# Patient Record
Sex: Male | Born: 1975 | Hispanic: Yes | Marital: Married | Smoking: Never smoker
Health system: Southern US, Community
[De-identification: ages and names within clinical notes are randomized; demographics above are authoritative.]

## PROBLEM LIST (undated history)

## (undated) DIAGNOSIS — Z789 Other specified health status: Secondary | ICD-10-CM

## (undated) HISTORY — PX: NO PAST SURGERIES: SHX2092

---

## 2020-11-13 DIAGNOSIS — J301 Allergic rhinitis due to pollen: Secondary | ICD-10-CM | POA: Insufficient documentation

## 2021-06-12 ENCOUNTER — Ambulatory Visit (INDEPENDENT_AMBULATORY_CARE_PROVIDER_SITE_OTHER): Payer: 59

## 2021-06-12 ENCOUNTER — Other Ambulatory Visit: Payer: Self-pay

## 2021-06-12 ENCOUNTER — Ambulatory Visit
Admission: EM | Admit: 2021-06-12 | Discharge: 2021-06-12 | Disposition: A | Discharge status: Home / Self Care | Payer: 59 | Attending: Nurse Practitioner | Admitting: Nurse Practitioner

## 2021-06-12 DIAGNOSIS — R059 Cough, unspecified: Secondary | ICD-10-CM | POA: Diagnosis not present

## 2021-06-12 DIAGNOSIS — K5900 Constipation, unspecified: Secondary | ICD-10-CM | POA: Diagnosis not present

## 2021-06-12 DIAGNOSIS — R079 Chest pain, unspecified: Secondary | ICD-10-CM

## 2021-06-12 DIAGNOSIS — R109 Unspecified abdominal pain: Secondary | ICD-10-CM

## 2021-06-12 DIAGNOSIS — R1011 Right upper quadrant pain: Secondary | ICD-10-CM | POA: Diagnosis not present

## 2021-06-12 MED ORDER — POLYETHYLENE GLYCOL 3350 17 GM/SCOOP PO POWD: 1.0000 | Freq: Once | ORAL | 0 refills | Status: AC

## 2021-06-12 MED ORDER — SENNA 8.6 MG PO TABS: 2.0000 | ORAL_TABLET | Freq: Every day | ORAL | 1 refills | Status: AC

## 2021-06-12 NOTE — ED Provider Notes (Signed)
[facility]    CSN: [id number] Arrival date & time: 06/12/21  1325      History   Chief Complaint Chief Complaint  Patient presents with   Back Pain    Chest and back pain    HPI Carl Hernandez is a 46 y.o. male.   The history is provided by the patient.   Patient presents with intermittent chest pain and abdominal pain that has been present for the past 2 weeks.  States pain starts in his mid right back and radiates to the right side of his chest/flank.  He denies shortness of breath, difficulty breathing, nausea, vomiting, or radiation of pain into his neck and jaw.  Patient also denies any previous history of reflux, although he does admit to having a history of seasonal allergies and has postnasal drainage which causes him to have moderate throat clearing.  He states that he does take Zyrtec daily.  Patient reports that he has not taken any medications for his symptoms.  He currently does not have a primary care physician.  History reviewed. No pertinent past medical history.  There are no problems to display for this patient.   History reviewed. No pertinent surgical history.     Home Medications    Prior to Admission medications   Medication Sig Start Date End Date Taking? Authorizing Provider  polyethylene glycol powder (GLYCOLAX/MIRALAX) 17 GM/SCOOP powder Take 255 g by mouth once for 1 dose. 06/12/21 06/12/21 Yes Keelee Yankey-Warren, Alda Lea, NP  senna (SENOKOT) 8.6 MG TABS tablet Take 2 tablets (17.2 mg total) by mouth at bedtime. 06/12/21 08/11/21 Yes Kenedy Haisley-Warren, Alda Lea, NP    Family History Family History  Problem Relation Age of Onset   Diabetes Mother    Heart attack Father     Social History Social History   Tobacco Use   Smoking status: Never    Passive exposure: Never   Smokeless tobacco: Never  Vaping Use   Vaping Use: Never used  Substance Use Topics   Alcohol use: Yes    Comment: Occas   Drug use: Never     Allergies   Patient  has no known allergies.   Review of Systems Review of Systems Per HPI  Physical Exam Triage Vital Signs ED Triage Vitals  Enc Vitals Group     BP 06/12/21 1353 134/83     Pulse Rate 06/12/21 1353 86     Resp 06/12/21 1353 20     Temp 06/12/21 1353 98.9 F (37.2 C)     Temp Source 06/12/21 1353 Oral     SpO2 06/12/21 1353 95 %     Weight --      Height --      Head Circumference --      Peak Flow --      Pain Score 06/12/21 1356 7     Pain Loc --      Pain Edu? --      Excl. in [place]? --    No data found.  Updated Vital Signs BP 134/83 (BP Location: Right Arm)   Pulse 86   Temp 98.9 F (37.2 C) (Oral)   Resp 20   SpO2 95%   Visual Acuity Right Eye Distance:   Left Eye Distance:   Bilateral Distance:    Right Eye Near:   Left Eye Near:    Bilateral Near:     Physical Exam Vitals and nursing note reviewed.  Constitutional:      General:  He is not in acute distress.    Appearance: He is well-developed.  HENT:     Head: Normocephalic and atraumatic.     Right Ear: Tympanic membrane, ear canal and external ear normal.     Left Ear: Tympanic membrane, ear canal and external ear normal.     Nose: Nose normal.  Eyes:     Extraocular Movements: Extraocular movements intact.     Conjunctiva/sclera: Conjunctivae normal.     Pupils: Pupils are equal, round, and reactive to light.  Cardiovascular:     Rate and Rhythm: Normal rate and regular rhythm.     Pulses: Normal pulses.     Heart sounds: Normal heart sounds. No murmur heard. Pulmonary:     Effort: Pulmonary effort is normal. No respiratory distress.     Breath sounds: Normal breath sounds.  Abdominal:     General: Bowel sounds are normal.     Palpations: Abdomen is soft.     Tenderness: There is no abdominal tenderness.  Musculoskeletal:        General: No swelling.     Cervical back: Neck supple.  Skin:    General: Skin is warm and dry.     Capillary Refill: Capillary refill takes less than 2 seconds.   Neurological:     General: No focal deficit present.     Mental Status: He is alert and oriented to person, place, and time.  Psychiatric:        Mood and Affect: Mood normal.        Behavior: Behavior normal.     UC Treatments / Results  Labs (all labs ordered are listed, but only abnormal results are displayed) Labs Reviewed - No data to display  EKG: NSR, no STEMI   Radiology DG Chest 2 View  Result Date: 06/12/2021 CLINICAL DATA:  chest painR sided CP traveling to his back x2 weeks with a cough EXAM: CHEST - 2 VIEW COMPARISON:  None Available. FINDINGS: The heart size and mediastinal contours are within normal limits. Both lungs are clear. The visualized skeletal structures are unremarkable. IMPRESSION: No active cardiopulmonary disease. Electronically Signed   By: Abigail Miyamoto M.D.   On: 06/12/2021 14:18   DG Abd 1 View  Result Date: 06/12/2021 CLINICAL DATA:  Abdominal pain EXAM: ABDOMEN - 1 VIEW COMPARISON:  None Available. FINDINGS: Lower chest and a small portion of the upper abdomen is excluded from the field of view. There is no evidence of bowel obstruction. Moderate stool burden. There are no radiopaque calculi overlying the kidneys or course of the ureters. No acute osseous abnormality. There is a focal sclerotic lesion within the left proximal femur, likely a bone island. IMPRESSION: Nonobstructive bowel gas pattern.  Moderate stool burden. No radiopaque calculi overlie the kidneys or course of the ureters. Lower chest and a small portion of the upper abdomen is excluded from the field of view. Electronically Signed   By: Maurine Simmering M.D.   On: 06/12/2021 14:48    Procedures Procedures (including critical care time)  Medications Ordered in UC Medications - No data to display  Initial Impression / Assessment and Plan / UC Course  I have reviewed the triage vital signs and the nursing notes.  Pertinent labs & imaging results that were available during my care of the  patient were reviewed by me and considered in my medical decision making (see chart for details).  Patient presents with complaints of chest/right upper quadrant abdominal pain that has been  present for the past 2 weeks.  Patient states pain radiates into his mid back.  His exam is reassuring, his vital signs were stable.  EKG showed normal sinus rhythm, no STEMI.  His chest x-ray was also normal.  Abdominal x-ray was performed which did show moderate stool burden.  No symptoms of kidney stone were present.  Patient was provided prescriptions for MiraLAX and senna for his constipation.  Recommended Tylenol as needed for pain.  Supportive care recommendations were provided to the patient.  Patient was offered PCP assistance during the visit today.  Patient advised to follow-up if he develops chest pain that radiates into his arm or jaw, shortness of breath, difficulty breathing, with nausea and vomiting. Final Clinical Impressions(s) / UC Diagnoses   Final diagnoses:  Abdominal pain, right upper quadrant  Constipation, unspecified constipation type     Discharge Instructions      Your EKG and chest x-ray were negative.  Your abdominal x-ray did show constipation which is consistent with your symptoms. Take medication as prescribed. Increase fluids.  Also recommend increasing the fiber in your diet. May take Tylenol as needed for pain or discomfort. As discussed, PCP assistance has been provided for you today.  You should receive a call within the next week to help you find a primary care physician. Follow-up as needed. Go the the emergency department if you develop chest pain that radiates into his arm or jaw, shortness of breath, difficulty breathing, with nausea and vomiting.     ED Prescriptions     Medication Sig Dispense Auth. Provider   polyethylene glycol powder (GLYCOLAX/MIRALAX) 17 GM/SCOOP powder Take 255 g by mouth once for 1 dose. 255 g Shelisa Fern-Warren, Alda Lea, NP   senna  (SENOKOT) 8.6 MG TABS tablet Take 2 tablets (17.2 mg total) by mouth at bedtime. 60 tablet Katlin Bortner-Warren, Alda Lea, NP      PDMP not reviewed this encounter.   Tish Men, NP 06/12/21 1517

## 2021-06-12 NOTE — ED Triage Notes (Signed)
Pt states that off and on for the last 2 weeks   Pt states he is having pain in his right breast area and sometimes in his left breast area radiating to his back  Pt states that he has lingering cough

## 2021-06-12 NOTE — Discharge Instructions (Addendum)
Your EKG and chest x-ray were negative.  Your abdominal x-ray did show constipation which is consistent with your symptoms. Take medication as prescribed. Increase fluids.  Also recommend increasing the fiber in your diet. May take Tylenol as needed for pain or discomfort. As discussed, PCP assistance has been provided for you today.  You should receive a call within the next week to help you find a primary care physician. Follow-up as needed. Go the the emergency department if you develop chest pain that radiates into his arm or jaw, shortness of breath, difficulty breathing, with nausea and vomiting.

## 2021-07-19 ENCOUNTER — Ambulatory Visit
Admission: EM | Admit: 2021-07-19 | Discharge: 2021-07-19 | Disposition: A | Discharge status: Home / Self Care | Payer: 59 | Attending: Nurse Practitioner | Admitting: Nurse Practitioner

## 2021-07-19 DIAGNOSIS — M545 Low back pain, unspecified: Secondary | ICD-10-CM | POA: Diagnosis not present

## 2021-07-19 MED ORDER — IBUPROFEN 800 MG PO TABS: 800.0000 mg | ORAL_TABLET | Freq: Three times a day (TID) | ORAL | 0 refills | Status: DC | PRN

## 2021-07-19 MED ORDER — KETOROLAC TROMETHAMINE 30 MG/ML IJ SOLN
30.0000 mg | Freq: Once | INTRAMUSCULAR | Status: AC
  Administered 2021-07-19: 30 mg via INTRAMUSCULAR

## 2021-07-19 MED ORDER — DEXAMETHASONE SODIUM PHOSPHATE 10 MG/ML IJ SOLN
10.0000 mg | INTRAMUSCULAR | Status: AC
  Administered 2021-07-19: 10 mg via INTRAMUSCULAR

## 2021-07-19 MED ORDER — METHOCARBAMOL 500 MG PO TABS: 500.0000 mg | ORAL_TABLET | Freq: Two times a day (BID) | ORAL | 0 refills | Status: DC

## 2021-07-19 NOTE — ED Triage Notes (Signed)
Pt presents with c/o lower back pain after throwing object in trash this am, pain radiates to left leg

## 2021-07-19 NOTE — ED Provider Notes (Signed)
[facility]    CSN: [id number] Arrival date & time: 07/19/21  4403      History   Chief Complaint Chief Complaint  Patient presents with   Back Pain    HPI Carl Hernandez is a 46 y.o. male.   The history is provided by the patient.   Patient presents for complaints of low back pain that started when he was throwing a Q-tip in the trash can this morning.  Patient states he felt a "catch" in the lower back.  He continues to have worsening back pain and spasms per his report.  Pain is greater on the left side versus the right.  He denies numbness, tingling, loss of bowel or bladder function, lower extremity weakness, urinary symptoms, or paresthesias.  He states that this usually happens to him once a year.  States that he took over-the-counter ibuprofen for his pain this morning.  History reviewed. No pertinent past medical history.  There are no problems to display for this patient.   History reviewed. No pertinent surgical history.     Home Medications    Prior to Admission medications   Medication Sig Start Date End Date Taking? Authorizing Provider  ibuprofen (ADVIL) 800 MG tablet Take 1 tablet (800 mg total) by mouth every 8 (eight) hours as needed. 07/19/21  Yes Avice Funchess-Warren, Alda Lea, NP  methocarbamol (ROBAXIN) 500 MG tablet Take 1 tablet (500 mg total) by mouth 2 (two) times daily. 07/19/21  Yes Daveah Varone-Warren, Alda Lea, NP  senna (SENOKOT) 8.6 MG TABS tablet Take 2 tablets (17.2 mg total) by mouth at bedtime. 06/12/21 08/11/21  Omari Mcmanaway-Warren, Alda Lea, NP    Family History Family History  Problem Relation Age of Onset   Diabetes Mother    Heart attack Father     Social History Social History   Tobacco Use   Smoking status: Never    Passive exposure: Never   Smokeless tobacco: Never  Vaping Use   Vaping Use: Never used  Substance Use Topics   Alcohol use: Yes    Comment: Occas   Drug use: Never     Allergies   Patient has no known  allergies.   Review of Systems Review of Systems Per HPI  Physical Exam Triage Vital Signs ED Triage Vitals  Enc Vitals Group     BP 07/19/21 0937 122/82     Pulse Rate 07/19/21 0937 (!) 56     Resp 07/19/21 0937 20     Temp 07/19/21 0937 98.1 F (36.7 C)     Temp src --      SpO2 07/19/21 0937 97 %     Weight --      Height --      Head Circumference --      Peak Flow --      Pain Score 07/19/21 0934 8     Pain Loc --      Pain Edu? --      Excl. in [place]? --    No data found.  Updated Vital Signs BP 122/82   Pulse (!) 56   Temp 98.1 F (36.7 C)   Resp 20   SpO2 97%   Visual Acuity Right Eye Distance:   Left Eye Distance:   Bilateral Distance:    Right Eye Near:   Left Eye Near:    Bilateral Near:     Physical Exam Vitals and nursing note reviewed.  Constitutional:      General: He is not  in acute distress.    Appearance: Normal appearance.  HENT:     Head: Normocephalic.  Eyes:     Extraocular Movements: Extraocular movements intact.     Conjunctiva/sclera: Conjunctivae normal.     Pupils: Pupils are equal, round, and reactive to light.  Cardiovascular:     Rate and Rhythm: Normal rate and regular rhythm.     Pulses: Normal pulses.  Pulmonary:     Effort: Pulmonary effort is normal.  Abdominal:     General: Bowel sounds are normal.     Palpations: Abdomen is soft.  Musculoskeletal:     Cervical back: Normal range of motion.     Lumbar back: Spasms and tenderness present. Decreased range of motion. Negative right straight leg raise test and negative left straight leg raise test.     Comments: Tenderness to the lumbar spine from L2-L5.  Bilateral paraspinal muscle tenderness.  Skin:    General: Skin is warm and dry.  Neurological:     General: No focal deficit present.     Mental Status: He is alert and oriented to person, place, and time.  Psychiatric:        Mood and Affect: Mood normal.        Behavior: Behavior normal.      UC  Treatments / Results  Labs (all labs ordered are listed, but only abnormal results are displayed) Labs Reviewed - No data to display  EKG   Radiology No results found.  Procedures Procedures (including critical care time)  Medications Ordered in UC Medications  ketorolac (TORADOL) 30 MG/ML injection 30 mg (30 mg Intramuscular Given 07/19/21 1024)  dexamethasone (DECADRON) injection 10 mg (10 mg Intramuscular Given 07/19/21 1024)    Initial Impression / Assessment and Plan / UC Course  I have reviewed the triage vital signs and the nursing notes.  Pertinent labs & imaging results that were available during my care of the patient were reviewed by me and considered in my medical decision making (see chart for details).  Patient presents for complaints of low back pain after throwing an object into the trash can this morning.  On exam, patient has tenderness to the lumbar spine and paraspinal region.  Tenderness is greater on the left side.  There are no red flags noted on his exam.  Toradol and Decadron injections were given in the clinic today.  Patient was prescribed ibuprofen and methocarbamol for his symptoms.  Supportive care recommendations were provided to the patient.  Patient was also given strict indications of when to go to the emergency department.  Patient advised to follow-up with his primary care if symptoms do not improve. Final Clinical Impressions(s) / UC Diagnoses   Final diagnoses:  Lumbar pain     Discharge Instructions      Take medication as prescribed. Try to remain as active as possible. Gentle range of motion and stretching exercises to help with back spasm and pain. May apply ice or heat as needed.  Ice is recommended for pain or swelling, heat for spasm or stiffness.  Apply for 20 minutes, remove for 1 hour, then repeat. May take over-the-counter Tylenol extra strength 500 mg tablet approximately 1 -2 hours after taking ibuprofen for breakthrough  pain. Go to the emergency department immediately if you develop weakness in your legs or feet, inability to walk, loss of bowel or bladder function, difficulty urinating or passing a bowel movement, or other concerns. Follow-up with your primary care physician if your symptoms  do not improve.      ED Prescriptions     Medication Sig Dispense Auth. Provider   ibuprofen (ADVIL) 800 MG tablet Take 1 tablet (800 mg total) by mouth every 8 (eight) hours as needed. 30 tablet Debie Ashline-Warren, Alda Lea, NP   methocarbamol (ROBAXIN) 500 MG tablet Take 1 tablet (500 mg total) by mouth 2 (two) times daily. 20 tablet Aurielle Slingerland-Warren, Alda Lea, NP      PDMP not reviewed this encounter.   Tish Men, NP 07/19/21 1031

## 2021-07-19 NOTE — Discharge Instructions (Addendum)
Take medication as prescribed. Try to remain as active as possible. Gentle range of motion and stretching exercises to help with back spasm and pain. May apply ice or heat as needed.  Ice is recommended for pain or swelling, heat for spasm or stiffness.  Apply for 20 minutes, remove for 1 hour, then repeat. May take over-the-counter Tylenol extra strength 500 mg tablet approximately 1 -2 hours after taking ibuprofen for breakthrough pain. Go to the emergency department immediately if you develop weakness in your legs or feet, inability to walk, loss of bowel or bladder function, difficulty urinating or passing a bowel movement, or other concerns. Follow-up with your primary care physician if your symptoms do not improve.

## 2021-08-22 DIAGNOSIS — K219 Gastro-esophageal reflux disease without esophagitis: Secondary | ICD-10-CM | POA: Insufficient documentation

## 2021-09-05 ENCOUNTER — Other Ambulatory Visit (HOSPITAL_COMMUNITY): Payer: Self-pay | Admitting: Nurse Practitioner

## 2021-09-05 DIAGNOSIS — R002 Palpitations: Secondary | ICD-10-CM

## 2021-09-06 ENCOUNTER — Ambulatory Visit (HOSPITAL_COMMUNITY)
Admission: RE | Admit: 2021-09-06 | Discharge: 2021-09-06 | Disposition: A | Discharge status: Home / Self Care | Payer: 59 | Source: Ambulatory Visit | Attending: Nurse Practitioner | Admitting: Nurse Practitioner

## 2021-09-06 DIAGNOSIS — R002 Palpitations: Secondary | ICD-10-CM | POA: Diagnosis present

## 2021-09-06 LAB — ECHOCARDIOGRAM COMPLETE
AR max vel: 2.62 cm2
AV Area VTI: 2.86 cm2
AV Area mean vel: 2.77 cm2
AV Mean grad: 3 mmHg
AV Peak grad: 7.6 mmHg
Ao pk vel: 1.38 m/s
Area-P 1/2: 3.33 cm2
Calc EF: 57.3 %
MV VTI: 2.78 cm2
S' Lateral: 3.1 cm
Single Plane A2C EF: 55.8 %
Single Plane A4C EF: 60.2 %

## 2021-09-06 NOTE — Progress Notes (Signed)
*  PRELIMINARY RESULTS* Echocardiogram 2D Echocardiogram has been performed.  Carl Hernandez 09/06/2021, 10:29 AM

## 2021-09-26 DIAGNOSIS — R0981 Nasal congestion: Secondary | ICD-10-CM | POA: Insufficient documentation

## 2021-09-26 DIAGNOSIS — R5381 Other malaise: Secondary | ICD-10-CM | POA: Insufficient documentation

## 2021-11-06 ENCOUNTER — Encounter: Payer: Self-pay | Admitting: Internal Medicine

## 2021-11-06 ENCOUNTER — Ambulatory Visit: Payer: 59 | Admitting: Internal Medicine

## 2021-11-06 VITALS — BP 127/82 | HR 69 | Ht 65.0 in | Wt 218.2 lb

## 2021-11-06 DIAGNOSIS — Z0001 Encounter for general adult medical examination with abnormal findings: Secondary | ICD-10-CM | POA: Diagnosis not present

## 2021-11-06 DIAGNOSIS — G8929 Other chronic pain: Secondary | ICD-10-CM

## 2021-11-06 DIAGNOSIS — R002 Palpitations: Secondary | ICD-10-CM | POA: Diagnosis not present

## 2021-11-06 DIAGNOSIS — R0789 Other chest pain: Secondary | ICD-10-CM

## 2021-11-06 NOTE — Assessment & Plan Note (Signed)
He reports a history of heart palpitations that have become more frequent and noticeable lately.  He underwent echocardiogram in August due to palpitations, which was unremarkable. -Referral placed to cardiology for further evaluation

## 2021-11-06 NOTE — Progress Notes (Signed)
New Patient Office Visit  Subjective    Patient ID: Carl Hernandez, male    DOB: 01-25-1975  Age: 46 y.o. MRN: [id number]  CC:  Chief Complaint  Patient presents with   Establish Care    HPI Carl Hernandez presents to establish care.  He is a 46 year old male with a past medical history significant for heart palpitations.  He was previously followed by Dr. Wende Neighbors.  Carl Hernandez acute concern today is chronic soreness on the left side of his chest.  He states this has been present for 6 to 7 years.  He has been unable to identify any exacerbating or alleviating factors.  No one has previously been able to give him explanation for his symptoms, which she describes as more of a dull aching vs pain.  He has been unable to identify exacerbating and alleviating factors.  He has previously undergone cardiac evaluation, which has been unremarkable.  Carl Hernandez also endorses recent heart palpitations that are getting worse.  He is concerned that this is related to his chest wall discomfort.  He underwent an echocardiogram in late August, which was unremarkable.  Acute concerns, chronic medical conditions, and outstanding preventative care items discussed today are individually addressed in A/P below  Outpatient Encounter Medications as of 11/06/2021  Medication Sig   ibuprofen (ADVIL) 800 MG tablet Take 1 tablet (800 mg total) by mouth every 8 (eight) hours as needed.   pantoprazole (PROTONIX) 40 MG tablet Take 40 mg by mouth daily.   [DISCONTINUED] methocarbamol (ROBAXIN) 500 MG tablet Take 1 tablet (500 mg total) by mouth 2 (two) times daily.   No facility-administered encounter medications on file as of 11/06/2021.   History reviewed. No pertinent past medical history.  History reviewed. No pertinent surgical history.  Family History  Problem Relation Age of Onset   Diabetes Mother    Heart attack Father    Social History   Socioeconomic History   Marital status: Married     Spouse name: Not on file   Number of children: Not on file   Years of education: Not on file   Highest education level: Not on file  Occupational History   Not on file  Tobacco Use   Smoking status: Never    Passive exposure: Never   Smokeless tobacco: Never  Vaping Use   Vaping Use: Never used  Substance and Sexual Activity   Alcohol use: Yes    Comment: Occas   Drug use: Never   Sexual activity: Yes    Birth control/protection: None  Other Topics Concern   Not on file  Social History Narrative   Not on file   Social Determinants of Health   Financial Resource Strain: Not on file  Food Insecurity: Not on file  Transportation Needs: Not on file  Physical Activity: Not on file  Stress: Not on file  Social Connections: Not on file  Intimate Partner Violence: Not on file    Review of Systems  Constitutional:  Negative for chills and fever.  HENT:  Negative for sore throat.   Respiratory:  Negative for cough and shortness of breath.   Cardiovascular:  Positive for palpitations. Negative for chest pain and leg swelling.  Gastrointestinal:  Negative for abdominal pain, blood in stool, constipation, diarrhea, nausea and vomiting.  Genitourinary:  Negative for dysuria and hematuria.  Musculoskeletal:  Negative for myalgias.       Left lower chest wall discomfort  Skin:  Negative for itching and  rash.  Neurological:  Negative for dizziness and headaches.  Psychiatric/Behavioral:  Negative for depression and suicidal ideas.    Objective    BP 127/82   Pulse 69   Ht '5\' 5"'$  (1.651 m)   Wt 218 lb 3.2 oz (99 kg)   SpO2 98%   BMI 36.31 kg/m   Physical Exam Vitals reviewed.  Constitutional:      General: He is not in acute distress.    Appearance: Normal appearance. He is obese. He is not ill-appearing.  HENT:     Head: Normocephalic and atraumatic.     Nose: Nose normal. No congestion or rhinorrhea.     Mouth/Throat:     Mouth: Mucous membranes are moist.      Pharynx: Oropharynx is clear.  Eyes:     Extraocular Movements: Extraocular movements intact.     Conjunctiva/sclera: Conjunctivae normal.     Pupils: Pupils are equal, round, and reactive to light.  Cardiovascular:     Rate and Rhythm: Normal rate and regular rhythm.     Pulses: Normal pulses.     Heart sounds: Normal heart sounds. No murmur heard. Pulmonary:     Effort: Pulmonary effort is normal.     Breath sounds: Normal breath sounds. No wheezing, rhonchi or rales.  Abdominal:     General: Abdomen is flat. Bowel sounds are normal. There is no distension.     Palpations: Abdomen is soft.     Tenderness: There is no abdominal tenderness.  Musculoskeletal:        General: No swelling or deformity. Normal range of motion.     Cervical back: Normal range of motion.     Comments: No obvious deformity on inspection of the left chest wall.  There is no significant tenderness to palpation.  There is a discrepancy in consistency and palpation of the left lower chest wall vs right.  The left chest wall has increased tissue density compared to the right.  Skin:    General: Skin is warm and dry.     Capillary Refill: Capillary refill takes less than 2 seconds.  Neurological:     General: No focal deficit present.     Mental Status: He is alert and oriented to person, place, and time.     Motor: No weakness.  Psychiatric:        Mood and Affect: Mood normal.        Behavior: Behavior normal.        Thought Content: Thought content normal.    Assessment & Plan:   Problem List Items Addressed This Visit       Encounter for general adult medical examination with abnormal findings    Presenting today to establish care.  Previously followed Dr. [place] Crane.  Medical records requested today. -He states that he recently had baseline lab work performed by Dr. [place] Crane and requests that we not repeat labs today. -Outstanding immunizations deferred today -He states that he was previously referred to GI  for colonoscopy but was told that his insurance would not cover screening (age 44-49).  He plans to contact his insurance company to see if they will cover screening colonoscopy. -Follow-up in 2-4 weeks to review recent labs and address outstanding preventative care items      Left-sided chest wall pain    Chronic issue with unclear etiology.  He has not been able to identify any exacerbating or alleviating factors.  Previous cardiac work-up unremarkable.  His discomfort seems rather superficial.  On palpation of the left chest wall there is increased tissue density compared to the right.  Previous chest x-ray does not show any obvious cause. -Previous records from Dr. [place] Crane requested. -He will follow-up in 2-4 weeks.  At that time we can ultrasound his chest wall and consider CT.      Heart palpitations    He reports a history of heart palpitations that have become more frequent and noticeable lately.  He underwent echocardiogram in August due to palpitations, which was unremarkable. -Referral placed to cardiology for further evaluation      Return in about 2 weeks (around 11/20/2021).   [name], MD

## 2021-11-06 NOTE — Patient Instructions (Signed)
It was a pleasure to see you today.  Thank you for giving [place] the opportunity to be involved in your care.  Below is a brief recap of your visit and next steps.  We will plan to see you again in 2-4 weeks.  Summary You have established care today. We will get records from Dr. [place] Crane I have referred you to cardiology to discuss heart palpations and next steps for workup I will discuss ultrasound vs CT with my colleague for chest wall evaluation

## 2021-11-06 NOTE — Assessment & Plan Note (Signed)
Chronic issue with unclear etiology.  He has not been able to identify any exacerbating or alleviating factors.  Previous cardiac work-up unremarkable.  His discomfort seems rather superficial.  On palpation of the left chest wall there is increased tissue density compared to the right.  Previous chest x-ray does not show any obvious cause. -Previous records from Dr. [place] Crane requested. -He will follow-up in 2-4 weeks.  At that time we can ultrasound his chest wall and consider CT.

## 2021-11-06 NOTE — Assessment & Plan Note (Signed)
Presenting today to establish care.  Previously followed Dr. [place] Crane.  Medical records requested today. -He states that he recently had baseline lab work performed by Dr. [place] Crane and requests that we not repeat labs today. -Outstanding immunizations deferred today -He states that he was previously referred to GI for colonoscopy but was told that his insurance would not cover screening (age 46-49).  He plans to contact his insurance company to see if they will cover screening colonoscopy. -Follow-up in 2-4 weeks to review recent labs and address outstanding preventative care items

## 2021-11-20 ENCOUNTER — Ambulatory Visit (INDEPENDENT_AMBULATORY_CARE_PROVIDER_SITE_OTHER): Payer: 59 | Admitting: Internal Medicine

## 2021-11-20 ENCOUNTER — Encounter: Payer: Self-pay | Admitting: Internal Medicine

## 2021-11-20 VITALS — BP 114/83 | HR 82 | Ht 65.0 in | Wt 215.0 lb

## 2021-11-20 DIAGNOSIS — E7800 Pure hypercholesterolemia, unspecified: Secondary | ICD-10-CM

## 2021-11-20 DIAGNOSIS — R0789 Other chest pain: Secondary | ICD-10-CM

## 2021-11-20 DIAGNOSIS — R002 Palpitations: Secondary | ICD-10-CM | POA: Diagnosis not present

## 2021-11-20 DIAGNOSIS — Z1211 Encounter for screening for malignant neoplasm of colon: Secondary | ICD-10-CM | POA: Diagnosis not present

## 2021-11-20 NOTE — Progress Notes (Signed)
Established Patient Office Visit  Subjective   Patient ID: Carl Hernandez, male    DOB: October 27, 1975  Age: 46 y.o. MRN: [id number]  Chief Complaint  Patient presents with   Follow-up   Carl Hernandez returns to care today.  He was last seen by me on 10/31 at which time he endorsed chronic soreness on the left side of his chest as well as heart palpitations.  He was referred to cardiology for further cardiac evaluation and records from his previous PCP were requested.  2-week follow-up was arranged to further discuss and address pain in his left chest wall.  There have been no acute interval events.  Today Carl Hernandez reports feeling well.  He has a cardiology appointment scheduled for January.  He continues to endorse left-sided chest wall pain and occasional palpitations.  He is otherwise asymptomatic without acute concerns discussed.  History reviewed. No pertinent past medical history. History reviewed. No pertinent surgical history. Social History   Tobacco Use   Smoking status: Never    Passive exposure: Never   Smokeless tobacco: Never  Vaping Use   Vaping Use: Never used  Substance Use Topics   Alcohol use: Yes    Comment: Occas   Drug use: Never   Family History  Problem Relation Age of Onset   Diabetes Mother    Heart attack Father    No Known Allergies  Review of Systems  Cardiovascular:  Positive for palpitations.  All other systems reviewed and are negative.    Objective:     BP 114/83   Pulse 82   Ht '5\' 5"'$  (1.651 m)   Wt 215 lb (97.5 kg)   SpO2 96%   BMI 35.78 kg/m  BP Readings from Last 3 Encounters:  11/20/21 114/83  11/06/21 127/82  07/19/21 122/82   Physical Exam Vitals reviewed.  Constitutional:      General: He is not in acute distress.    Appearance: Normal appearance. He is not ill-appearing.  HENT:     Head: Normocephalic and atraumatic.     Right Ear: External ear normal.     Left Ear: External ear normal.     Nose: Nose normal. No  congestion or rhinorrhea.     Mouth/Throat:     Mouth: Mucous membranes are moist.     Pharynx: Oropharynx is clear.  Eyes:     Extraocular Movements: Extraocular movements intact.     Conjunctiva/sclera: Conjunctivae normal.     Pupils: Pupils are equal, round, and reactive to light.  Cardiovascular:     Rate and Rhythm: Normal rate and regular rhythm.     Pulses: Normal pulses.     Heart sounds: Normal heart sounds. No murmur heard. Pulmonary:     Effort: Pulmonary effort is normal.     Breath sounds: Normal breath sounds. No wheezing, rhonchi or rales.  Abdominal:     General: Abdomen is flat. Bowel sounds are normal. There is no distension.     Palpations: Abdomen is soft.     Tenderness: There is no abdominal tenderness.  Musculoskeletal:        General: No swelling or deformity. Normal range of motion.     Cervical back: Normal range of motion.     Comments: There increased density along the left anterior chest wall inferior and lateral to the nipple. The area is non-tender to palpation.  Skin:    General: Skin is warm and dry.     Capillary Refill: Capillary refill  takes less than 2 seconds.  Neurological:     General: No focal deficit present.     Mental Status: He is alert and oriented to person, place, and time.     Motor: No weakness.  Psychiatric:        Mood and Affect: Mood normal.        Behavior: Behavior normal.        Thought Content: Thought content normal.      Assessment & Plan:   Problem List Items Addressed This Visit       Left-sided chest wall pain    He returns to care today for evaluation of a left chest wall mass that has been present for several years without explanation.  The area is located on the anterior left chest wall inferior and lateral to the left nipple.  There is no tenderness to palpation on exam.  This area was examined with ultrasound today and there were no obvious concerning findings.  I discussed these results with Mr.  Carl Hernandez, who does not desire further work-up or imaging at this time.      Heart palpitations    He continues to endorse occasional heart palpitations today.  He was previously referred to cardiology and has an appointment scheduled for January.      Elevated LDL cholesterol level    Lipid panel from August reviewed today.  Total cholesterol 163, LDL 106.  We discussed limiting fatty and fried foods in order to lower his LDL.      Screening for colon cancer - Primary    GI referral placed today for screening colonoscopy       Return in about 3 months (around 02/20/2022).    Carl Abraham, MD

## 2021-11-20 NOTE — Patient Instructions (Signed)
It was a pleasure to see you today.  Thank you for giving [place] the opportunity to be involved in your care.  Below is a brief recap of your visit and next steps.  We will plan to see you again in 3 months.  Summary No medication changes today We will follow up in 3 months You have been referred for screening colonoscopy

## 2021-11-21 ENCOUNTER — Encounter: Payer: Self-pay | Admitting: *Deleted

## 2021-11-26 DIAGNOSIS — Z1211 Encounter for screening for malignant neoplasm of colon: Secondary | ICD-10-CM | POA: Insufficient documentation

## 2021-11-26 DIAGNOSIS — E7800 Pure hypercholesterolemia, unspecified: Secondary | ICD-10-CM | POA: Insufficient documentation

## 2021-11-26 NOTE — Assessment & Plan Note (Signed)
He returns to care today for evaluation of a left chest wall mass that has been present for several years without explanation.  The area is located on the anterior left chest wall inferior and lateral to the left nipple.  There is no tenderness to palpation on exam.  This area was examined with ultrasound today and there were no obvious concerning findings.  I discussed these results with Carl Hernandez, who does not desire further work-up or imaging at this time.

## 2021-11-26 NOTE — Assessment & Plan Note (Signed)
He continues to endorse occasional heart palpitations today.  He was previously referred to cardiology and has an appointment scheduled for January.

## 2021-11-26 NOTE — Assessment & Plan Note (Signed)
GI referral placed today for screening colonoscopy

## 2021-11-26 NOTE — Assessment & Plan Note (Signed)
Lipid panel from August reviewed today.  Total cholesterol 163, LDL 106.  We discussed limiting fatty and fried foods in order to lower his LDL.

## 2021-12-13 ENCOUNTER — Encounter: Payer: Self-pay | Admitting: *Deleted

## 2021-12-13 NOTE — Patient Instructions (Signed)
  Procedure: colonoscopy  Estimated body mass index is 35.78 kg/m as calculated from the following:   Height as of this encounter: '5\' 5"'$  (1.651 m).   Weight as of this encounter: 215 lb (97.5 kg).   Have you had a colonoscopy before?  no  Do you have family history of colon cancer?  no  Do you have a family history of polyps? no  Previous colonoscopy with polyps removed? no  Do you have a history colorectal cancer?   no  Are you diabetic?  no  Do you have a prosthetic or mechanical heart valve? no  Do you have a pacemaker/defibrillator?   no  Have you had endocarditis/atrial fibrillation?  no  Do you use supplemental oxygen/CPAP?  no  Have you had joint replacement within the last 12 months?  no  Do you tend to be constipated or have to use laxatives?  no   Do you have history of alcohol use? If yes, how much and how often.  2 drinks per week  Do you have history or are you using drugs? If yes, what do are you  using?  no  Have you ever had a stroke/heart attack?  no  Have you ever had a heart or other vascular stent placed,?no  Do you take weight loss medication? no   Do you take any blood-thinning medications such as: (Plavix, aspirin, Coumadin, Aggrenox, Brilinta, Xarelto, Eliquis, Pradaxa, Savaysa or Effient)? no  If yes we need the name, milligram, dosage and who is prescribing doctor:               Current Outpatient Medications  Medication Sig Dispense Refill   pantoprazole (PROTONIX) 40 MG tablet Take 40 mg by mouth daily.     No current facility-administered medications for this visit.    No Known Allergies

## 2022-01-15 ENCOUNTER — Encounter: Payer: Self-pay | Admitting: *Deleted

## 2022-01-15 MED ORDER — PEG 3350-KCL-NA BICARB-NACL 420 G PO SOLR: 4000.0000 mL | Freq: Once | ORAL | 0 refills | Status: AC

## 2022-01-15 NOTE — Progress Notes (Signed)
Pt called back. Scheduled for 2/2 with Dr. Abbey Chatters. Aware will send instructions in mail. Rx for prep sent to pharmacy.

## 2022-01-15 NOTE — Progress Notes (Signed)
LMOVM to call back to schedule 

## 2022-01-18 ENCOUNTER — Encounter (INDEPENDENT_AMBULATORY_CARE_PROVIDER_SITE_OTHER): Payer: Self-pay | Admitting: *Deleted

## 2022-01-18 NOTE — Progress Notes (Signed)
Referral completed

## 2022-01-23 ENCOUNTER — Ambulatory Visit: Payer: 59 | Admitting: Internal Medicine

## 2022-02-06 ENCOUNTER — Other Ambulatory Visit (HOSPITAL_COMMUNITY): Payer: 59

## 2022-02-08 ENCOUNTER — Encounter (HOSPITAL_COMMUNITY): Payer: Self-pay

## 2022-02-08 ENCOUNTER — Ambulatory Visit (HOSPITAL_COMMUNITY)
Admission: RE | Admit: 2022-02-08 | Discharge: 2022-02-08 | Disposition: A | Discharge status: Home / Self Care | Payer: 59 | Attending: Internal Medicine | Admitting: Internal Medicine

## 2022-02-08 ENCOUNTER — Other Ambulatory Visit: Payer: Self-pay

## 2022-02-08 ENCOUNTER — Ambulatory Visit (HOSPITAL_COMMUNITY): Payer: 59 | Admitting: Anesthesiology

## 2022-02-08 ENCOUNTER — Encounter (HOSPITAL_COMMUNITY)
Admission: RE | Disposition: A | Discharge status: Home / Self Care | Payer: Self-pay | Source: Home / Self Care | Attending: Internal Medicine

## 2022-02-08 DIAGNOSIS — D125 Benign neoplasm of sigmoid colon: Secondary | ICD-10-CM

## 2022-02-08 DIAGNOSIS — Z1211 Encounter for screening for malignant neoplasm of colon: Secondary | ICD-10-CM | POA: Diagnosis present

## 2022-02-08 HISTORY — DX: Other specified health status: Z78.9

## 2022-02-08 HISTORY — PX: POLYPECTOMY: SHX5525

## 2022-02-08 HISTORY — PX: COLONOSCOPY WITH PROPOFOL: SHX5780

## 2022-02-08 SURGERY — COLONOSCOPY WITH PROPOFOL
Anesthesia: General

## 2022-02-08 MED ORDER — LACTATED RINGERS IV SOLN: INTRAVENOUS | Status: DC

## 2022-02-08 MED ORDER — PROPOFOL 10 MG/ML IV BOLUS
INTRAVENOUS | Status: DC | PRN
  Administered 2022-02-08: 110 mg via INTRAVENOUS
  Administered 2022-02-08: 40 mg via INTRAVENOUS
  Administered 2022-02-08: 50 mg via INTRAVENOUS
  Administered 2022-02-08: 20 mg via INTRAVENOUS

## 2022-02-08 MED ORDER — LIDOCAINE HCL (CARDIAC) PF 100 MG/5ML IV SOSY
PREFILLED_SYRINGE | INTRAVENOUS | Status: DC | PRN
  Administered 2022-02-08: 50 mg via INTRAVENOUS

## 2022-02-08 NOTE — Anesthesia Preprocedure Evaluation (Addendum)
Anesthesia Evaluation  Patient identified by MRN, date of birth, ID band Patient awake    Reviewed: Allergy & Precautions, H&P , NPO status , Patient's Chart, lab work & pertinent test results  Airway Mallampati: II  TM Distance: >3 FB Neck ROM: Full    Dental  (+) Dental Advisory Given, Teeth Intact   Pulmonary neg pulmonary ROS   Pulmonary exam normal breath sounds clear to auscultation       Cardiovascular Exercise Tolerance: Good Normal cardiovascular exam+ dysrhythmias (palpitations)  Rhythm:Regular Rate:Normal  1. Left ventricular ejection fraction, by estimation, is 55 to 60%. The  left ventricle has normal function. The left ventricle has no regional  wall motion abnormalities. Left ventricular diastolic parameters were  normal.   2. Right ventricular systolic function is normal. The right ventricular  size is normal. Tricuspid regurgitation signal is inadequate for assessing  PA pressure.   3. The mitral valve is grossly normal. Trivial mitral valve  regurgitation.   4. The aortic valve is tricuspid. Aortic valve regurgitation is not  visualized. Aortic valve mean gradient measures 3.0 mmHg.   5. The inferior vena cava is normal in size with greater than 50%  respiratory variability, suggesting right atrial pressure of 3 mmHg.     Neuro/Psych negative neurological ROS  negative psych ROS   GI/Hepatic Neg liver ROS,GERD  Controlled,,  Endo/Other  negative endocrine ROS    Renal/GU negative Renal ROS  negative genitourinary   Musculoskeletal negative musculoskeletal ROS (+)    Abdominal   Peds negative pediatric ROS (+)  Hematology negative hematology ROS (+)   Anesthesia Other Findings Left sided chest wall pain  Reproductive/Obstetrics negative OB ROS                             Anesthesia Physical Anesthesia Plan  ASA: 2  Anesthesia Plan: General   Post-op Pain  Management: Minimal or no pain anticipated   Induction: Intravenous  PONV Risk Score and Plan: 1 and Propofol infusion  Airway Management Planned: Nasal Cannula and Natural Airway  Additional Equipment:   Intra-op Plan:   Post-operative Plan:   Informed Consent: I have reviewed the patients History and Physical, chart, labs and discussed the procedure including the risks, benefits and alternatives for the proposed anesthesia with the patient or authorized representative who has indicated his/her understanding and acceptance.     Dental advisory given  Plan Discussed with: CRNA and Surgeon  Anesthesia Plan Comments:        Anesthesia Quick Evaluation

## 2022-02-08 NOTE — Discharge Instructions (Addendum)
  Colonoscopy Discharge Instructions  Read the instructions outlined below and refer to this sheet in the next few weeks. These discharge instructions provide you with general information on caring for yourself after you leave the hospital. Your doctor may also give you specific instructions. While your treatment has been planned according to the most current medical practices available, unavoidable complications occasionally occur.   ACTIVITY You may resume your regular activity, but move at a slower pace for the next 24 hours.  Take frequent rest periods for the next 24 hours.  Walking will help get rid of the air and reduce the bloated feeling in your belly (abdomen).  No driving for 24 hours (because of the medicine (anesthesia) used during the test).   Do not sign any important legal documents or operate any machinery for 24 hours (because of the anesthesia used during the test).  NUTRITION Drink plenty of fluids.  You may resume your normal diet as instructed by your doctor.  Begin with a light meal and progress to your normal diet. Heavy or fried foods are harder to digest and may make you feel sick to your stomach (nauseated).  Avoid alcoholic beverages for 24 hours or as instructed.  MEDICATIONS You may resume your normal medications unless your doctor tells you otherwise.  WHAT YOU CAN EXPECT TODAY Some feelings of bloating in the abdomen.  Passage of more gas than usual.  Spotting of blood in your stool or on the toilet paper.  IF YOU HAD POLYPS REMOVED DURING THE COLONOSCOPY: No aspirin products for 7 days or as instructed.  No alcohol for 7 days or as instructed.  Eat a soft diet for the next 24 hours.  FINDING OUT THE RESULTS OF YOUR TEST Not all test results are available during your visit. If your test results are not back during the visit, make an appointment with your caregiver to find out the results. Do not assume everything is normal if you have not heard from your  caregiver or the medical facility. It is important for you to follow up on all of your test results.  SEEK IMMEDIATE MEDICAL ATTENTION IF: You have more than a spotting of blood in your stool.  Your belly is swollen (abdominal distention).  You are nauseated or vomiting.  You have a temperature over 101.  You have abdominal pain or discomfort that is severe or gets worse throughout the day.   Your colonoscopy revealed 1 small polyp which I removed successfully.  This is likely benign, nothing to worry about.  Repeat colonoscopy 7 to 10 years depending on pathology results which we will call you with next week.  Otherwise follow-up as needed.  I hope you have a great rest of your week!  Elon Alas. Abbey Chatters, D.O. Gastroenterology and Hepatology [facility]

## 2022-02-08 NOTE — H&P (Signed)
Primary Care Physician:  [name], MD Primary Gastroenterologist:  Dr. Abbey Chatters  Pre-Procedure History & Physical: HPI:  Carl Hernandez is a 47 y.o. male is here for first ever colonoscopy for colon cancer screening purposes.  Patient denies any family history of colorectal cancer.  No melena or hematochezia.  No abdominal pain or unintentional weight loss.  No change in bowel habits.  Overall feels well from a GI standpoint.  Past Medical History:  Diagnosis Date   Medical history non-contributory     Past Surgical History:  Procedure Laterality Date   NO PAST SURGERIES      Prior to Admission medications   Medication Sig Start Date End Date Taking? Authorizing Provider  Multiple Vitamins-Minerals (MULTIVITAMIN WITH MINERALS) tablet Take 1 tablet by mouth daily.   Yes [provider]    Allergies as of 01/15/2022   (No Known Allergies)    Family History  Problem Relation Age of Onset   Diabetes Mother    Heart attack Father    Colon cancer Neg Hx     Social History   Socioeconomic History   Marital status: Married    Spouse name: Not on file   Number of children: Not on file   Years of education: Not on file   Highest education level: Not on file  Occupational History   Not on file  Tobacco Use   Smoking status: Never    Passive exposure: Never   Smokeless tobacco: Never  Vaping Use   Vaping Use: Never used  Substance and Sexual Activity   Alcohol use: Yes    Comment: Occas   Drug use: Never   Sexual activity: Yes    Birth control/protection: None  Other Topics Concern   Not on file  Social History Narrative   Not on file   Social Determinants of Health   Financial Resource Strain: Not on file  Food Insecurity: Not on file  Transportation Needs: Not on file  Physical Activity: Not on file  Stress: Not on file  Social Connections: Not on file  Intimate Partner Violence: Not on file    Review of Systems: See HPI, otherwise negative  ROS  Physical Exam: Vital signs in last 24 hours: Temp:  [98.6 F (37 C)] 98.6 F (37 C) (02/02 0643) Pulse Rate:  [64] 64 (02/02 0643) Resp:  [12] 12 (02/02 0643) BP: (107)/(82) 107/82 (02/02 0643) SpO2:  [96 %] 96 % (02/02 0643) Weight:  [97.5 kg] 97.5 kg (02/02 0643)   General:   Alert,  Well-developed, well-nourished, pleasant and cooperative in NAD Head:  Normocephalic and atraumatic. Eyes:  Sclera clear, no icterus.   Conjunctiva pink. Ears:  Normal auditory acuity. Nose:  No deformity, discharge,  or lesions. Msk:  Symmetrical without gross deformities. Normal posture. Extremities:  Without clubbing or edema. Neurologic:  Alert and  oriented x4;  grossly normal neurologically. Skin:  Intact without significant lesions or rashes. Psych:  Alert and cooperative. Normal mood and affect.  Impression/Plan: Carl Hernandez is here for a colonoscopy to be performed for colon cancer screening purposes.  The risks of the procedure including infection, bleed, or perforation as well as benefits, limitations, alternatives and imponderables have been reviewed with the patient. Questions have been answered. All parties agreeable.

## 2022-02-08 NOTE — Transfer of Care (Signed)
Immediate Anesthesia Transfer of Care Note  Patient: Carl Hernandez  Procedure(s) Performed: COLONOSCOPY WITH PROPOFOL POLYPECTOMY  Patient Location: Endoscopy Unit  Anesthesia Type:General  Level of Consciousness: awake  Airway & Oxygen Therapy: Patient Spontanous Breathing  Post-op Assessment: Report given to RN and Post -op Vital signs reviewed and stable  Post vital signs: Reviewed and stable  Last Vitals:  Vitals Value Taken Time  BP 91/60 02/08/22 0746  Temp 36.6 C 02/08/22 0746  Pulse 76 02/08/22 0746  Resp 20 02/08/22 0746  SpO2 95 % 02/08/22 0746    Last Pain:  Vitals:   02/08/22 0746  TempSrc: Oral  PainSc:       Patients Stated Pain Goal: 4 (95/58/31 6742)  Complications: No notable events documented.

## 2022-02-08 NOTE — Anesthesia Postprocedure Evaluation (Signed)
Anesthesia Post Note  Patient: Carl Hernandez  Procedure(s) Performed: COLONOSCOPY WITH PROPOFOL POLYPECTOMY  Patient location during evaluation: Phase II Anesthesia Type: General Level of consciousness: awake and alert and oriented Pain management: pain level controlled Vital Signs Assessment: post-procedure vital signs reviewed and stable Respiratory status: spontaneous breathing, nonlabored ventilation and respiratory function stable Cardiovascular status: blood pressure returned to baseline and stable Postop Assessment: no apparent nausea or vomiting Anesthetic complications: no  No notable events documented.   Last Vitals:  Vitals:   02/08/22 0746 02/08/22 0749  BP: 91/60 107/87  Pulse: 76   Resp: 20   Temp: 36.6 C   SpO2: 95%     Last Pain:  Vitals:   02/08/22 0749  TempSrc:   PainSc: 0-No pain                 Shakevia Sarris C Ronia Hazelett

## 2022-02-08 NOTE — Op Note (Signed)
[facility] Patient Name: Carl Hernandez Procedure Date: 02/08/2022 7:04 AM MRN: [id number] Date of Birth: [birth year] Attending MD: Elon Alas. Abbey Chatters , [place], [id number] CSN: [id number] Age: 47 Admit Type: Outpatient Procedure:                Colonoscopy Indications:              Screening for colorectal malignant neoplasm Providers:                Elon Alas. Abbey Chatters, DO, Tammy Vaught, RN, Wynonia Musty Tech, Technician Referring MD:              Medicines:                See the Anesthesia note for documentation of the                            administered medications Complications:            No immediate complications. Estimated Blood Loss:     Estimated blood loss was minimal. Procedure:                Pre-Anesthesia Assessment:                           - The anesthesia plan was to use monitored                            anesthesia care (MAC).                           After obtaining informed consent, the colonoscope                            was passed under direct vision. Throughout the                            procedure, the patient's blood pressure, pulse, and                            oxygen saturations were monitored continuously. The                            [id number]-HQ190L ([id number]) scope was introduced through                            the anus and advanced to the the cecum, identified                            by appendiceal orifice and ileocecal valve. The                            colonoscopy was performed without difficulty. The                            patient tolerated the procedure  well. The quality                            of the bowel preparation was evaluated using the                            BBPS [facility] Bowel Preparation Scale) with scores                            of: Right Colon = 3, Transverse Colon = 3 and Left                            Colon = 3 (entire mucosa seen well with no residual                             staining, small fragments of stool or opaque                            liquid). The total BBPS score equals 9. Scope In: 7:33:29 AM Scope Out: 7:43:50 AM Scope Withdrawal Time: 0 hours 8 minutes 27 seconds  Total Procedure Duration: 0 hours 10 minutes 21 seconds  Findings:      The perianal and digital rectal examinations were normal.      A 4 mm polyp was found in the sigmoid colon. The polyp was sessile. The       polyp was removed with a cold snare. Resection and retrieval were       complete.      The entire examined colon appeared normal on direct and retroflexion       views. Impression:               - One 4 mm polyp in the sigmoid colon, removed with                            a cold snare. Resected and retrieved.                           - The entire examined colon is normal on direct and                            retroflexion views. Moderate Sedation:      Per Anesthesia Care Recommendation:           - Patient has a contact number available for                            emergencies. The signs and symptoms of potential                            delayed complications were discussed with the                            patient. Return to normal activities tomorrow.  Written discharge instructions were provided to the                            patient.                           - Resume previous diet.                           - Continue present medications.                           - Await pathology results.                           - Repeat colonoscopy in 7-10 years for surveillance.                           - Return to GI clinic PRN. Procedure Code(s):        --- Professional ---                           [phone], Colonoscopy, flexible; with removal of                            tumor(s), polyp(s), or other lesion(s) by snare                            technique Diagnosis Code(s):        --- Professional ---                            Z12.11, Encounter for screening for malignant                            neoplasm of colon                           D12.5, Benign neoplasm of sigmoid colon CPT copyright 2022 [employer]. All rights reserved. The codes documented in this report are preliminary and upon coder review may  be revised to meet current compliance requirements. Elon Alas. Abbey Chatters, DO [place] Abbey Chatters, DO 02/08/2022 7:47:30 AM This report has been signed electronically. Number of Addenda: 0

## 2022-02-12 LAB — SURGICAL PATHOLOGY

## 2022-02-15 ENCOUNTER — Encounter (HOSPITAL_COMMUNITY): Payer: Self-pay | Admitting: Internal Medicine

## 2022-02-20 ENCOUNTER — Ambulatory Visit: Payer: 59 | Admitting: Internal Medicine

## 2022-02-20 ENCOUNTER — Encounter: Payer: Self-pay | Admitting: Internal Medicine

## 2022-02-20 VITALS — BP 124/80 | HR 78 | Ht 65.0 in | Wt 214.4 lb

## 2022-02-20 DIAGNOSIS — E7800 Pure hypercholesterolemia, unspecified: Secondary | ICD-10-CM

## 2022-02-20 DIAGNOSIS — E669 Obesity, unspecified: Secondary | ICD-10-CM | POA: Diagnosis not present

## 2022-02-20 DIAGNOSIS — Z23 Encounter for immunization: Secondary | ICD-10-CM

## 2022-02-20 NOTE — Patient Instructions (Signed)
It was a pleasure to see you today.  Thank you for giving [place] the opportunity to be involved in your care.  Below is a brief recap of your visit and next steps.  We will plan to see you again in 6 months.  Summary No changes today. Everything looks great. Continue to work on limiting fatty / fried foods and exercising regularly to reduce your cholesterol We will plan for follow up in August for your annual exam.

## 2022-02-20 NOTE — Progress Notes (Signed)
Established Patient Office Visit  Subjective   Patient ID: Carl Hernandez, male    DOB: 05/30/1975  Age: 47 y.o. MRN: [id number]  Chief Complaint  Patient presents with   Hyperlipidemia    Follow up   Mr. Karau returns to care today for routine follow-up.  He was last seen by me on 11/14 for further evaluation of the left-sided chest wall pain.  No medication changes were made at that time.  In the interim he underwent screening colonoscopy earlier this month.  No concerning lesions were identified.  Repeat colonoscopy recommended for 10 years.  There have otherwise been no acute interval events.  Mr. Sadek reports feeling well today.  He is asymptomatic and has no acute concerns to discuss.  Past Medical History:  Diagnosis Date   Medical history non-contributory    Past Surgical History:  Procedure Laterality Date   COLONOSCOPY WITH PROPOFOL N/A 02/08/2022   Procedure: COLONOSCOPY WITH PROPOFOL;  Surgeon: Eloise Harman, DO;  Location: [facility];  Service: Endoscopy;  Laterality: N/A;  730am, asa 2   NO PAST SURGERIES     POLYPECTOMY  02/08/2022   Procedure: POLYPECTOMY;  Surgeon: Eloise Harman, DO;  Location: [facility];  Service: Endoscopy;;   Social History   Tobacco Use   Smoking status: Never    Passive exposure: Never   Smokeless tobacco: Never  Vaping Use   Vaping Use: Never used  Substance Use Topics   Alcohol use: Yes    Comment: Occas   Drug use: Never   Family History  Problem Relation Age of Onset   Diabetes Mother    Heart attack Father    Colon cancer Neg Hx    Allergies  Allergen Reactions   Red Blood Cells     REFUSES BLOOD PRODUCTS   Review of Systems  Constitutional:  Negative for chills and fever.  HENT:  Negative for sore throat.   Respiratory:  Negative for cough and shortness of breath.   Cardiovascular:  Negative for chest pain, palpitations and leg swelling.  Gastrointestinal:  Negative for abdominal pain, blood in  stool, constipation, diarrhea, nausea and vomiting.  Genitourinary:  Negative for dysuria and hematuria.  Musculoskeletal:  Negative for myalgias.  Skin:  Negative for itching and rash.  Neurological:  Negative for dizziness and headaches.  Psychiatric/Behavioral:  Negative for depression and suicidal ideas.      Objective:     BP 124/80   Pulse 78   Ht 5' 5"$  (1.651 m)   Wt 214 lb 6.4 oz (97.3 kg)   SpO2 94%   BMI 35.68 kg/m  BP Readings from Last 3 Encounters:  02/20/22 124/80  02/08/22 107/87  11/20/21 114/83   Physical Exam Vitals reviewed.  Constitutional:      General: He is not in acute distress.    Appearance: Normal appearance. He is obese. He is not ill-appearing.  HENT:     Head: Normocephalic and atraumatic.     Right Ear: External ear normal.     Left Ear: External ear normal.     Nose: Nose normal. No congestion or rhinorrhea.     Mouth/Throat:     Mouth: Mucous membranes are moist.     Pharynx: Oropharynx is clear.  Eyes:     General: No scleral icterus.    Extraocular Movements: Extraocular movements intact.     Conjunctiva/sclera: Conjunctivae normal.     Pupils: Pupils are equal, round, and reactive to light.  Cardiovascular:     Rate and Rhythm: Normal rate and regular rhythm.     Pulses: Normal pulses.     Heart sounds: Normal heart sounds. No murmur heard. Pulmonary:     Effort: Pulmonary effort is normal.     Breath sounds: Normal breath sounds. No wheezing, rhonchi or rales.  Abdominal:     General: Abdomen is flat. Bowel sounds are normal. There is no distension.     Palpations: Abdomen is soft.     Tenderness: There is no abdominal tenderness.  Musculoskeletal:        General: No swelling or deformity. Normal range of motion.     Cervical back: Normal range of motion.  Skin:    General: Skin is warm and dry.     Capillary Refill: Capillary refill takes less than 2 seconds.     Findings: Lesion (Palpable, superficial mass along the  distal aspect of the left chest wall below the nipple line) present.  Neurological:     General: No focal deficit present.     Mental Status: He is alert and oriented to person, place, and time.     Motor: No weakness.  Psychiatric:        Mood and Affect: Mood normal.        Behavior: Behavior normal.        Thought Content: Thought content normal.      Assessment & Plan:   Problem List Items Addressed This Visit       Elevated LDL cholesterol level - Primary    Lipid panel last updated in August 2023.  Total cholesterol 163 and LDL 106. -No medication changes today.  He was again counseled on limiting fatty/fried foods and incorporating regular exercise into his weekly routine. -Follow-up in 6 months for repeat lipid panel      Need for Tdap vaccination    Tdap vaccine administered today      Obesity (BMI 35.0-39.9 without comorbidity)    BMI 35.6 today.  He was counseled on lifestyle modifications aimed at losing weight, including dietary changes and incorporating at least 150 total minutes of moderate intensity exercise into his weekly routine. -Follow-up in 6 months for routine care and repeat labs      Return in about 6 months (around 08/21/2022) for CPE.   [name], MD

## 2022-02-26 DIAGNOSIS — E669 Obesity, unspecified: Secondary | ICD-10-CM | POA: Insufficient documentation

## 2022-02-26 DIAGNOSIS — Z23 Encounter for immunization: Secondary | ICD-10-CM | POA: Insufficient documentation

## 2022-02-26 NOTE — Assessment & Plan Note (Signed)
Tdap vaccine administered today

## 2022-02-26 NOTE — Assessment & Plan Note (Signed)
BMI 35.6 today.  He was counseled on lifestyle modifications aimed at losing weight, including dietary changes and incorporating at least 150 total minutes of moderate intensity exercise into his weekly routine. -Follow-up in 6 months for routine care and repeat labs

## 2022-02-26 NOTE — Assessment & Plan Note (Signed)
Lipid panel last updated in August 2023.  Total cholesterol 163 and LDL 106. -No medication changes today.  He was again counseled on limiting fatty/fried foods and incorporating regular exercise into his weekly routine. -Follow-up in 6 months for repeat lipid panel

## 2022-09-10 ENCOUNTER — Encounter: Payer: 59 | Admitting: Internal Medicine

## 2022-11-04 ENCOUNTER — Encounter: Payer: Self-pay | Admitting: Family Medicine

## 2022-11-04 ENCOUNTER — Ambulatory Visit: Payer: BC Managed Care – PPO | Admitting: Family Medicine

## 2022-11-04 VITALS — BP 132/88 | HR 82 | Temp 98.3°F | Resp 16 | Ht 65.0 in | Wt 211.1 lb

## 2022-11-04 DIAGNOSIS — J0100 Acute maxillary sinusitis, unspecified: Secondary | ICD-10-CM | POA: Diagnosis not present

## 2022-11-04 MED ORDER — PROMETHAZINE-DM 6.25-15 MG/5ML PO SYRP: 5.0000 mL | ORAL_SOLUTION | Freq: Four times a day (QID) | ORAL | 0 refills | Status: DC | PRN

## 2022-11-04 MED ORDER — AZITHROMYCIN 250 MG PO TABS: ORAL_TABLET | ORAL | 0 refills | Status: AC

## 2022-11-04 MED ORDER — FLUTICASONE PROPIONATE 50 MCG/ACT NA SUSP: 2.0000 | Freq: Every day | NASAL | 6 refills | Status: AC

## 2022-11-04 NOTE — Patient Instructions (Addendum)
I appreciate the opportunity to provide care to you today!  Treatment Plan:  Start taking Azithromycin as prescribed. Use Flonase nasal spray for nasal congestion. Take Promethazine DM for cough relief. Additional Recommendations:  Take all medications as prescribed. Increase fluid intake and ensure plenty of rest. Use Tylenol  as needed for pain, fever, or general discomfort. Perform warm salt water gargles 3-4 times daily to help with throat pain or discomfort. Use a humidifier at bedtime to help with cough and nasal congestion. Follow up if your symptoms do not improve.    Please continue to a heart-healthy diet and increase your physical activities. Try to exercise for at least five days a week.    It was a pleasure to see you and I look forward to continuing to work together on your health and well-being. Please do not hesitate to call the office if you need care or have questions about your care.  In case of emergency, please visit the Emergency Department for urgent care, or contact our clinic at [phone] to schedule an appointment. We're here to help you!   Have a wonderful day and week. With Gratitude, Gilmore Laroche MSN, FNP-BC

## 2022-11-04 NOTE — Assessment & Plan Note (Signed)
Treatment Plan:  Start taking Azithromycin as prescribed. Use Flonase nasal spray for nasal congestion. Take Promethazine DM for cough relief. Additional Recommendations:  Take all medications as prescribed. Increase fluid intake and ensure plenty of rest. Use Tylenol  as needed for pain, fever, or general discomfort. Perform warm salt water gargles 3-4 times daily to help with throat pain or discomfort. Use a humidifier at bedtime to help with cough and nasal congestion. Follow up if your symptoms do not improve.

## 2022-11-04 NOTE — Progress Notes (Signed)
Acute Office Visit  Subjective:    Patient ID: Carl Hernandez, male    DOB: November 06, 1975, 47 y.o.   MRN: [id number]  Chief Complaint  Patient presents with   Nasal Congestion    X Friday, cough, sinus congestion and sore throat and low grade fever    Cough   Sore Throat    HPI The patient is in today with complaints of low-grade fever, nasal congestion, facial pain and pressure, sore throat, and a cough producing purulent phlegm. He reports that his symptoms started on 10/31/2022 but worsened on 11/01/2022. He has not taken any over-the-counter medications yet.   Past Medical History:  Diagnosis Date   Medical history non-contributory     Past Surgical History:  Procedure Laterality Date   COLONOSCOPY WITH PROPOFOL N/A 02/08/2022   Procedure: COLONOSCOPY WITH PROPOFOL;  Surgeon: Lanelle Bal, DO;  Location: [facility];  Service: Endoscopy;  Laterality: N/A;  730am, asa 2   NO PAST SURGERIES     POLYPECTOMY  02/08/2022   Procedure: POLYPECTOMY;  Surgeon: Lanelle Bal, DO;  Location: [facility];  Service: Endoscopy;;    Family History  Problem Relation Age of Onset   Diabetes Mother    Heart attack Father    Colon cancer Neg Hx     Social History   Socioeconomic History   Marital status: Married    Spouse name: Not on file   Number of children: Not on file   Years of education: Not on file   Highest education level: Not on file  Occupational History   Not on file  Tobacco Use   Smoking status: Never    Passive exposure: Never   Smokeless tobacco: Never  Vaping Use   Vaping status: Never Used  Substance and Sexual Activity   Alcohol use: Yes    Comment: Occas   Drug use: Never   Sexual activity: Yes    Birth control/protection: None  Other Topics Concern   Not on file  Social History Narrative   Not on file   Social Determinants of Health   Financial Resource Strain: Not on file  Food Insecurity: Not on file  Transportation Needs: Not on  file  Physical Activity: Not on file  Stress: Not on file  Social Connections: Not on file  Intimate Partner Violence: Not on file    Outpatient Medications Prior to Visit  Medication Sig Dispense Refill   Multiple Vitamins-Minerals (MULTIVITAMIN WITH MINERALS) tablet Take 1 tablet by mouth daily.     No facility-administered medications prior to visit.    Allergies  Allergen Reactions   Red Blood Cells     REFUSES BLOOD PRODUCTS    Review of Systems  Constitutional:  Negative for fatigue and fever.  HENT:  Positive for congestion, postnasal drip, rhinorrhea, sinus pressure and sore throat.   Eyes:  Negative for visual disturbance.  Respiratory:  Positive for cough. Negative for chest tightness and shortness of breath.   Cardiovascular:  Negative for chest pain and palpitations.  Neurological:  Negative for dizziness and headaches.       Objective:    Physical Exam HENT:     Head: Normocephalic.     Right Ear: External ear normal.     Left Ear: External ear normal.     Nose: No congestion or rhinorrhea.     Right Sinus: Maxillary sinus tenderness present.     Left Sinus: Maxillary sinus tenderness present.  Mouth/Throat:     Mouth: Mucous membranes are moist.  Cardiovascular:     Rate and Rhythm: Regular rhythm.     Heart sounds: No murmur heard. Pulmonary:     Effort: No respiratory distress.     Breath sounds: Normal breath sounds.  Neurological:     Mental Status: He is alert.     BP 132/88   Pulse 82   Temp 98.3 F (36.8 C) (Oral)   Resp 16   Ht 5\' 5"  (1.651 m)   Wt 211 lb 1.9 oz (95.8 kg)   SpO2 95%   BMI 35.13 kg/m  Wt Readings from Last 3 Encounters:  11/04/22 211 lb 1.9 oz (95.8 kg)  02/20/22 214 lb 6.4 oz (97.3 kg)  02/08/22 215 lb (97.5 kg)       Assessment & Plan:  Subacute maxillary sinusitis Assessment & Plan: Treatment Plan:  Start taking Azithromycin as prescribed. Use Flonase nasal spray for nasal congestion. Take  Promethazine DM for cough relief. Additional Recommendations:  Take all medications as prescribed. Increase fluid intake and ensure plenty of rest. Use Tylenol  as needed for pain, fever, or general discomfort. Perform warm salt water gargles 3-4 times daily to help with throat pain or discomfort. Use a humidifier at bedtime to help with cough and nasal congestion. Follow up if your symptoms do not improve.  Orders: -     Azithromycin; Take 2 tablets on day 1, then 1 tablet daily on days 2 through 5  Dispense: 6 tablet; Refill: 0 -     Promethazine-DM; Take 5 mLs by mouth 4 (four) times daily as needed.  Dispense: 118 mL; Refill: 0 -     Fluticasone Propionate; Place 2 sprays into both nostrils daily.  Dispense: 16 g; Refill: 6  Note: This chart has been completed using Engineer, civil (consulting) software, and while attempts have been made to ensure accuracy, certain words and phrases may not be transcribed as intended.    Gilmore Laroche, FNP

## 2022-11-12 ENCOUNTER — Encounter: Payer: Self-pay | Admitting: Internal Medicine

## 2022-11-12 ENCOUNTER — Ambulatory Visit (INDEPENDENT_AMBULATORY_CARE_PROVIDER_SITE_OTHER): Payer: BC Managed Care – PPO | Admitting: Internal Medicine

## 2022-11-12 VITALS — BP 116/80 | HR 92 | Ht 65.0 in | Wt 212.8 lb

## 2022-11-12 DIAGNOSIS — E669 Obesity, unspecified: Secondary | ICD-10-CM | POA: Diagnosis not present

## 2022-11-12 DIAGNOSIS — E7800 Pure hypercholesterolemia, unspecified: Secondary | ICD-10-CM

## 2022-11-12 DIAGNOSIS — Z23 Encounter for immunization: Secondary | ICD-10-CM | POA: Diagnosis not present

## 2022-11-12 DIAGNOSIS — Z0001 Encounter for general adult medical examination with abnormal findings: Secondary | ICD-10-CM | POA: Diagnosis not present

## 2022-11-12 DIAGNOSIS — R002 Palpitations: Secondary | ICD-10-CM

## 2022-11-12 DIAGNOSIS — Z1159 Encounter for screening for other viral diseases: Secondary | ICD-10-CM | POA: Diagnosis not present

## 2022-11-12 DIAGNOSIS — Z114 Encounter for screening for human immunodeficiency virus [HIV]: Secondary | ICD-10-CM | POA: Diagnosis not present

## 2022-11-12 DIAGNOSIS — D539 Nutritional anemia, unspecified: Secondary | ICD-10-CM | POA: Diagnosis not present

## 2022-11-12 NOTE — Assessment & Plan Note (Signed)
Influenza vaccine administered today.

## 2022-11-12 NOTE — Progress Notes (Signed)
Complete physical exam  Patient: Carl Hernandez   DOB: 06-26-1975   47 y.o. Male  MRN: [id number]  Subjective:    Chief Complaint  Patient presents with   Annual Exam    Carl Hernandez is a 47 y.o. male who presents today for a complete physical exam. He reports consuming a general diet. The patient does not participate in regular exercise at present. He generally feels fairly well. He reports sleeping fairly well. He does not have additional problems to discuss today.    Most recent fall risk assessment:    11/12/2022    2:02 PM  Fall Risk   Falls in the past year? 0  Number falls in past yr: 0  Injury with Fall? 0  Risk for fall due to : No Fall Risks  Follow up Falls evaluation completed     Most recent depression screenings:    11/12/2022    2:02 PM 11/04/2022    8:40 AM  PHQ 2/9 Scores  PHQ - 2 Score 0 0    Vision:Not within last year  and Dental: No current dental problems and Receives regular dental care  Past Medical History:  Diagnosis Date   Medical history non-contributory    Past Surgical History:  Procedure Laterality Date   COLONOSCOPY WITH PROPOFOL N/A 02/08/2022   Procedure: COLONOSCOPY WITH PROPOFOL;  Surgeon: Lanelle Bal, DO;  Location: [facility];  Service: Endoscopy;  Laterality: N/A;  730am, asa 2   NO PAST SURGERIES     POLYPECTOMY  02/08/2022   Procedure: POLYPECTOMY;  Surgeon: Lanelle Bal, DO;  Location: [facility];  Service: Endoscopy;;   Social History   Tobacco Use   Smoking status: Never    Passive exposure: Never   Smokeless tobacco: Never  Vaping Use   Vaping status: Never Used  Substance Use Topics   Alcohol use: Yes    Comment: Occas   Drug use: Never   Family History  Problem Relation Age of Onset   Diabetes Mother    Heart attack Father    Colon cancer Neg Hx    Allergies  Allergen Reactions   Red Blood Cells     REFUSES BLOOD PRODUCTS   Patient Care Team: Billie Lade, MD as PCP - General  (Internal Medicine)   Outpatient Medications Prior to Visit  Medication Sig   fluticasone (FLONASE) 50 MCG/ACT nasal spray Place 2 sprays into both nostrils daily.   Multiple Vitamins-Minerals (MULTIVITAMIN WITH MINERALS) tablet Take 1 tablet by mouth daily.   promethazine-dextromethorphan (PROMETHAZINE-DM) 6.25-15 MG/5ML syrup Take 5 mLs by mouth 4 (four) times daily as needed.   No facility-administered medications prior to visit.   Review of Systems  Constitutional:  Negative for chills and fever.  HENT:  Positive for congestion. Negative for sore throat.   Respiratory:  Negative for cough and shortness of breath.   Cardiovascular:  Negative for chest pain, palpitations and leg swelling.  Gastrointestinal:  Negative for abdominal pain, blood in stool, constipation, diarrhea, nausea and vomiting.  Genitourinary:  Negative for dysuria and hematuria.  Musculoskeletal:  Negative for myalgias.  Skin:  Negative for itching and rash.  Neurological:  Negative for dizziness and headaches.  Psychiatric/Behavioral:  Negative for depression and suicidal ideas.       Objective:     BP 116/80 (BP Location: Right Arm, Patient Position: Sitting, Cuff Size: Large)   Pulse 92   Ht 5\' 5"  (1.651 m)   Wt 212  lb 12.8 oz (96.5 kg)   SpO2 96%   BMI 35.41 kg/m  BP Readings from Last 3 Encounters:  11/12/22 116/80  11/04/22 132/88  02/20/22 124/80   Physical Exam Vitals reviewed.  Constitutional:      General: He is not in acute distress.    Appearance: Normal appearance. He is obese. He is not ill-appearing.  HENT:     Head: Normocephalic and atraumatic.     Right Ear: External ear normal.     Left Ear: External ear normal.     Nose: Nose normal. No congestion or rhinorrhea.     Mouth/Throat:     Mouth: Mucous membranes are moist.     Pharynx: Oropharynx is clear.  Eyes:     General: No scleral icterus.    Extraocular Movements: Extraocular movements intact.     Conjunctiva/sclera:  Conjunctivae normal.     Pupils: Pupils are equal, round, and reactive to light.  Cardiovascular:     Rate and Rhythm: Normal rate and regular rhythm.     Pulses: Normal pulses.     Heart sounds: Normal heart sounds. No murmur heard. Pulmonary:     Effort: Pulmonary effort is normal.     Breath sounds: Normal breath sounds. No wheezing, rhonchi or rales.  Abdominal:     General: Abdomen is flat. Bowel sounds are normal. There is no distension.     Palpations: Abdomen is soft.     Tenderness: There is no abdominal tenderness.  Musculoskeletal:        General: No swelling or deformity. Normal range of motion.     Cervical back: Normal range of motion.  Skin:    General: Skin is warm and dry.     Capillary Refill: Capillary refill takes less than 2 seconds.     Findings: Lesion (Palpable, superficial mass along the distal aspect of the left chest wall below the nipple line) present.  Neurological:     General: No focal deficit present.     Mental Status: He is alert and oriented to person, place, and time.     Motor: No weakness.  Psychiatric:        Mood and Affect: Mood normal.        Behavior: Behavior normal.        Thought Content: Thought content normal.        Assessment & Plan:    Routine Health Maintenance and Physical Exam  Immunization History  Administered Date(s) Administered   Influenza, Seasonal, Injecte, Preservative Fre 11/12/2022   Influenza,inj,Quad PF,6+ Mos 11/13/2020   Influenza,inj,quad, With Preservative 10/18/2021   Moderna SARS-COV2 Booster Vaccination 02/03/2021   Moderna Sars-Covid-2 Vaccination 03/04/2019, 04/01/2019, 11/17/2019   Tdap 02/20/2022    Health Maintenance  Topic Date Due   Hepatitis C Screening  Never done   COVID-19 Vaccine (5 - 2023-24 season) 09/08/2022   Colonoscopy  02/09/2032   DTaP/Tdap/Td (2 - Td or Tdap) 02/21/2032   INFLUENZA VACCINE  Completed   HIV Screening  Completed   HPV VACCINES  Aged Out    Discussed  health benefits of physical activity, and encouraged him to engage in regular exercise appropriate for his age and condition.  Problem List Items Addressed This Visit       Heart palpitations    He continues to endorse heart palpitations that he associates with caffeine intake.  Previously referred to cardiology but canceled his appointment.  We discussed cardiac monitoring today and I offered to prescribe a  Zio patch.  He will consider this and notify our office if he is interested.      Elevated LDL cholesterol level    Lipid panel last updated in August 2023.  Total cholesterol 163 and LDL 106.  He is not on a cholesterol-lowering medication currently.  Repeat lipid panel ordered today.      Obesity (BMI 35.0-39.9 without comorbidity)    BMI 35.4.  Lifestyle modifications aimed at weight loss were reviewed again today.  Repeat labs ordered.      Encounter for well adult exam with abnormal findings - Primary    Annual physical exam completed today.  Previous records and labs reviewed. -Labs ordered today, including one-time HIV/HCV screenings -Influenza vaccine administered today -We will tentatively plan for follow-up in 1 year for annual physical      Need for influenza vaccination    Influenza vaccine administered today      Return in about 1 year (around 11/12/2023) for CPE.  Billie Lade, MD

## 2022-11-12 NOTE — Patient Instructions (Signed)
It was a pleasure to see you today.  Thank you for giving [place] the opportunity to be involved in your care.  Below is a brief recap of your visit and next steps.  We will plan to see you again in 1 year.  Summary Annual physical completed today Repeat labs ordered Flu shot today Follow up in 1 year for annual exam

## 2022-11-12 NOTE — Assessment & Plan Note (Signed)
BMI 35.4.  Lifestyle modifications aimed at weight loss were reviewed again today.  Repeat labs ordered.

## 2022-11-12 NOTE — Assessment & Plan Note (Signed)
He continues to endorse heart palpitations that he associates with caffeine intake.  Previously referred to cardiology but canceled his appointment.  We discussed cardiac monitoring today and I offered to prescribe a Zio patch.  He will consider this and notify our office if he is interested.

## 2022-11-12 NOTE — Assessment & Plan Note (Signed)
Annual physical exam completed today.  Previous records and labs reviewed. -Labs ordered today, including one-time HIV/HCV screenings -Influenza vaccine administered today -We will tentatively plan for follow-up in 1 year for annual physical

## 2022-11-12 NOTE — Assessment & Plan Note (Signed)
Lipid panel last updated in August 2023.  Total cholesterol 163 and LDL 106.  He is not on a cholesterol-lowering medication currently.  Repeat lipid panel ordered today.

## 2022-11-13 LAB — LIPID PANEL
Chol/HDL Ratio: 5 ratio (ref 0.0–5.0)
Cholesterol, Total: 175 mg/dL (ref 100–199)
HDL: 35 mg/dL — ABNORMAL LOW (ref >39)
LDL Chol Calc (NIH): 97 mg/dL (ref 0–99)
Triglycerides: 250 mg/dL — ABNORMAL HIGH (ref 0–149)
VLDL Cholesterol Cal: 43 mg/dL — ABNORMAL HIGH (ref 5–40)

## 2022-11-13 LAB — CBC WITH DIFFERENTIAL/PLATELET
Basophils Absolute: 0 10*3/uL (ref 0.0–0.2)
Basos: 0 %
EOS (ABSOLUTE): 0.2 10*3/uL (ref 0.0–0.4)
Eos: 2 %
Hematocrit: 45.7 % (ref 37.5–51.0)
Hemoglobin: 15.2 g/dL (ref 13.0–17.7)
Immature Grans (Abs): 0.1 10*3/uL (ref 0.0–0.1)
Immature Granulocytes: 1 %
Lymphocytes Absolute: 2.7 10*3/uL (ref 0.7–3.1)
Lymphs: 30 %
MCH: 29.6 pg (ref 26.6–33.0)
MCHC: 33.3 g/dL (ref 31.5–35.7)
MCV: 89 fL (ref 79–97)
Monocytes Absolute: 0.6 10*3/uL (ref 0.1–0.9)
Monocytes: 7 %
Neutrophils Absolute: 5.5 10*3/uL (ref 1.4–7.0)
Neutrophils: 60 %
Platelets: 327 10*3/uL (ref 150–450)
RBC: 5.14 x10E6/uL (ref 4.14–5.80)
RDW: 11.9 % (ref 11.6–15.4)
WBC: 9 10*3/uL (ref 3.4–10.8)

## 2022-11-13 LAB — CMP14+EGFR
ALT: 39 [IU]/L (ref 0–44)
AST: 23 [IU]/L (ref 0–40)
Albumin: 4.3 g/dL (ref 4.1–5.1)
Alkaline Phosphatase: 91 [IU]/L (ref 44–121)
BUN/Creatinine Ratio: 12 (ref 9–20)
BUN: 13 mg/dL (ref 6–24)
Bilirubin Total: 0.5 mg/dL (ref 0.0–1.2)
CO2: 24 mmol/L (ref 20–29)
Calcium: 9.4 mg/dL (ref 8.7–10.2)
Chloride: 101 mmol/L (ref 96–106)
Creatinine, Ser: 1.05 mg/dL (ref 0.76–1.27)
Globulin, Total: 2.8 g/dL (ref 1.5–4.5)
Glucose: 79 mg/dL (ref 70–99)
Potassium: 4.1 mmol/L (ref 3.5–5.2)
Sodium: 139 mmol/L (ref 134–144)
Total Protein: 7.1 g/dL (ref 6.0–8.5)
eGFR: 88 mL/min/{1.73_m2} (ref >59)

## 2022-11-13 LAB — HCV AB W REFLEX TO QUANT PCR: HCV Ab: NONREACTIVE

## 2022-11-13 LAB — HCV INTERPRETATION

## 2022-11-13 LAB — VITAMIN D 25 HYDROXY (VIT D DEFICIENCY, FRACTURES): Vit D, 25-Hydroxy: 32.5 ng/mL (ref 30.0–100.0)

## 2022-11-13 LAB — TSH+FREE T4
Free T4: 1.32 ng/dL (ref 0.82–1.77)
TSH: 1 u[IU]/mL (ref 0.450–4.500)

## 2022-11-13 LAB — B12 AND FOLATE PANEL
Folate: 12.7 ng/mL (ref >3.0)
Vitamin B-12: 608 pg/mL (ref 232–1245)

## 2022-11-13 LAB — HEMOGLOBIN A1C
Est. average glucose Bld gHb Est-mCnc: 114 mg/dL
Hgb A1c MFr Bld: 5.6 % (ref 4.8–5.6)

## 2022-11-13 LAB — HIV ANTIBODY (ROUTINE TESTING W REFLEX): HIV Screen 4th Generation wRfx: NONREACTIVE

## 2023-01-09 ENCOUNTER — Ambulatory Visit: Payer: BC Managed Care – PPO | Attending: Internal Medicine

## 2023-01-09 ENCOUNTER — Ambulatory Visit (INDEPENDENT_AMBULATORY_CARE_PROVIDER_SITE_OTHER): Payer: BC Managed Care – PPO | Admitting: Internal Medicine

## 2023-01-09 ENCOUNTER — Encounter: Payer: Self-pay | Admitting: Internal Medicine

## 2023-01-09 VITALS — BP 118/82 | HR 70 | Ht 65.0 in | Wt 214.0 lb

## 2023-01-09 DIAGNOSIS — K219 Gastro-esophageal reflux disease without esophagitis: Secondary | ICD-10-CM | POA: Diagnosis not present

## 2023-01-09 DIAGNOSIS — R002 Palpitations: Secondary | ICD-10-CM

## 2023-01-09 MED ORDER — OMEPRAZOLE 20 MG PO CPDR: 20.0000 mg | DELAYED_RELEASE_CAPSULE | Freq: Every day | ORAL | 3 refills | Status: DC

## 2023-01-09 NOTE — Patient Instructions (Signed)
 It was a pleasure to see you today.  Thank you for giving [place] the opportunity to be involved in your care.  Below is a brief recap of your visit and next steps.   Summary 14-day event monitor ordered Trial omeprazole for reflux

## 2023-01-09 NOTE — Assessment & Plan Note (Signed)
 Not currently on any antacid medication.  Today he endorses frequently clearing his throat.  Symptoms are worse after eating.  Suspect untreated GERD. -Trial omeprazole 20 mg daily.  Can increase frequency to twice daily if needed.

## 2023-01-09 NOTE — Progress Notes (Signed)
 Acute Office Visit  Subjective:     Patient ID: Carl Hernandez, male    DOB: 10/03/1975, 48 y.o.   MRN: [id number]  Chief Complaint  Patient presents with   Follow-up    Patient would like to discuss zio patch and have it ordered.    Carl Hernandez returns to care today for an acute visit in the setting of heart palpitations.  He was previously evaluated by me on 11/5 at which time he continued to endorse heart palpitations.  I offered to prescribe a Zio patch.  He declined at that time and stated he would follow-up with our office if he became interested.  Previously referred to cardiology as well but canceled his appointment.  Today he continues to endorse heart palpitations and states that they seem to occur after eating.  He has also noted recent dizziness episode as well.  Denies syncope and additional symptoms.  He is ready to proceed with placement of an event monitor as previously discussed.  Review of Systems  Cardiovascular:  Positive for palpitations.  Gastrointestinal:  Positive for heartburn.  Neurological:  Positive for dizziness.      Objective:    BP 118/82   Pulse 70   Ht 5' 5 (1.651 m)   Wt 214 lb (97.1 kg)   SpO2 96%   BMI 35.61 kg/m   Physical Exam Vitals reviewed.  Constitutional:      General: He is not in acute distress.    Appearance: Normal appearance. He is obese. He is not ill-appearing.  HENT:     Head: Normocephalic and atraumatic.     Right Ear: External ear normal.     Left Ear: External ear normal.     Nose: Nose normal. No congestion or rhinorrhea.     Mouth/Throat:     Mouth: Mucous membranes are moist.     Pharynx: Oropharynx is clear.  Eyes:     General: No scleral icterus.    Extraocular Movements: Extraocular movements intact.     Conjunctiva/sclera: Conjunctivae normal.     Pupils: Pupils are equal, round, and reactive to light.  Cardiovascular:     Rate and Rhythm: Normal rate and regular rhythm.     Pulses: Normal pulses.      Heart sounds: Normal heart sounds. No murmur heard. Pulmonary:     Effort: Pulmonary effort is normal.     Breath sounds: Normal breath sounds. No wheezing, rhonchi or rales.  Abdominal:     General: Abdomen is flat. Bowel sounds are normal. There is no distension.     Palpations: Abdomen is soft.     Tenderness: There is no abdominal tenderness.  Musculoskeletal:        General: No swelling or deformity. Normal range of motion.     Cervical back: Normal range of motion.  Skin:    General: Skin is warm and dry.     Capillary Refill: Capillary refill takes less than 2 seconds.  Neurological:     General: No focal deficit present.     Mental Status: He is alert and oriented to person, place, and time.     Motor: No weakness.  Psychiatric:        Mood and Affect: Mood normal.        Behavior: Behavior normal.        Thought Content: Thought content normal.      Assessment & Plan:   Problem List Items Addressed This Visit  Gastroesophageal reflux disease without esophagitis   Not currently on any antacid medication.  Today he endorses frequently clearing his throat.  Symptoms are worse after eating.  Suspect untreated GERD. -Trial omeprazole  20 mg daily.  Can increase frequency to twice daily if needed.      Heart palpitations - Primary   Presenting today for an acute visit in the setting of persistent heart palpitations.  He has endorsed heart palpitations since at least October 2023.  Most recently, he endorses onset of dizziness.  Denies syncope.  We previously discussed placement of an event monitor and he is ready to proceed. -14-day Zio patch ordered today.  Further management pending results.       Meds ordered this encounter  Medications   omeprazole  (PRILOSEC) 20 MG capsule    Sig: Take 1 capsule (20 mg total) by mouth daily.    Dispense:  30 capsule    Refill:  3    Return if symptoms worsen or fail to improve.  Manus FORBES Fireman, MD

## 2023-01-09 NOTE — Progress Notes (Unsigned)
 EP to read.

## 2023-01-09 NOTE — Assessment & Plan Note (Signed)
 Presenting today for an acute visit in the setting of persistent heart palpitations.  He has endorsed heart palpitations since at least October 2023.  Most recently, he endorses onset of dizziness.  Denies syncope.  We previously discussed placement of an event monitor and he is ready to proceed. -14-day Zio patch ordered today.  Further management pending results.

## 2023-01-14 DIAGNOSIS — R002 Palpitations: Secondary | ICD-10-CM

## 2023-02-04 ENCOUNTER — Encounter: Payer: Self-pay | Admitting: Internal Medicine

## 2023-02-04 DIAGNOSIS — R002 Palpitations: Secondary | ICD-10-CM | POA: Diagnosis not present

## 2023-02-11 DIAGNOSIS — J22 Unspecified acute lower respiratory infection: Secondary | ICD-10-CM | POA: Diagnosis not present

## 2023-02-11 DIAGNOSIS — R059 Cough, unspecified: Secondary | ICD-10-CM | POA: Diagnosis not present

## 2023-06-21 IMAGING — DX DG CHEST 2V
2 series · 2 of 2 positions shown · non-contrast
Comparison: None Available.

CLINICAL DATA: chest painR sided CP traveling to his back x2 weeks
with a cough

EXAM:
CHEST - 2 VIEW

[chest pa]
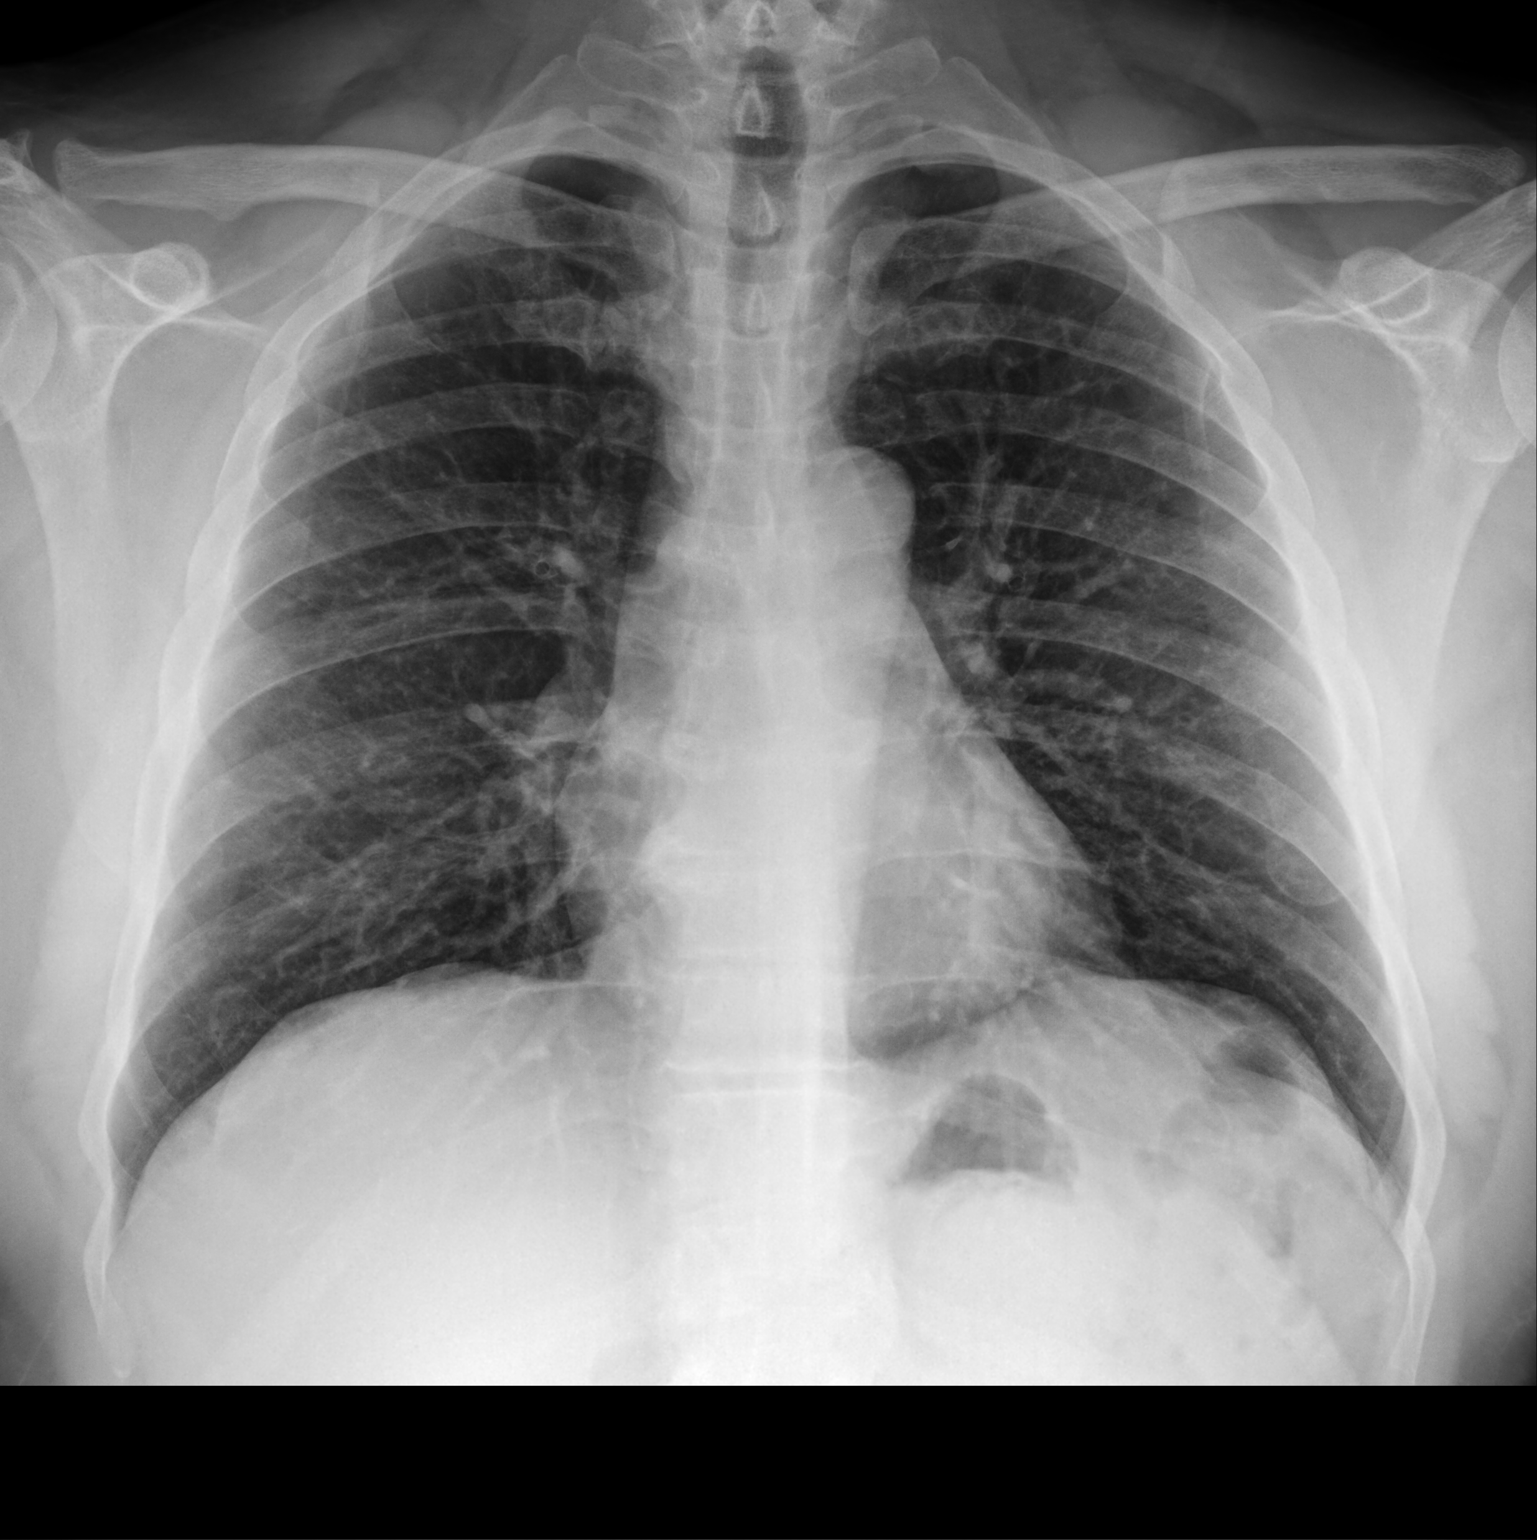

[chest lat]
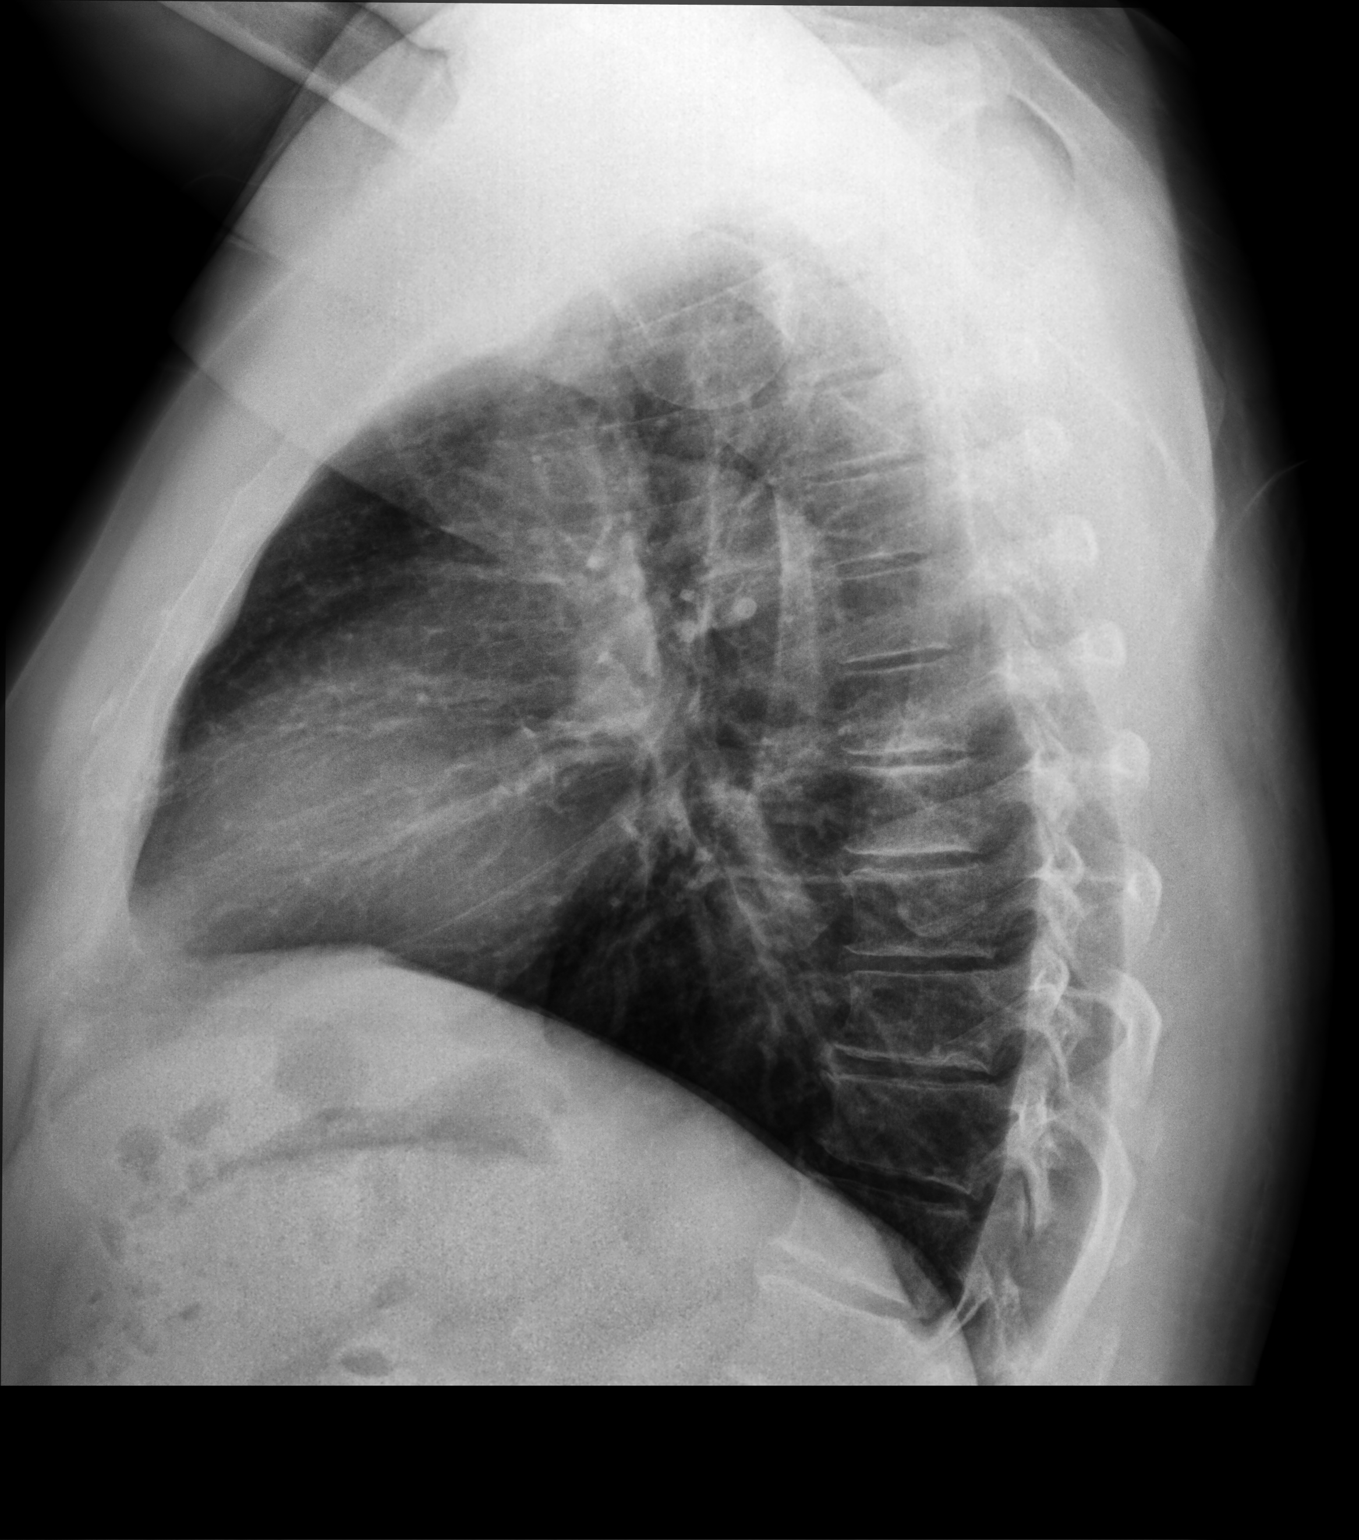

[2 of 2 positions shown; findings below may reference images not displayed]

FINDINGS: The heart size and mediastinal contours are within normal limits.
Both lungs are clear. The visualized skeletal structures are
unremarkable.
IMPRESSION: No active cardiopulmonary disease.

## 2023-10-07 ENCOUNTER — Telehealth: Payer: Self-pay

## 2023-10-07 NOTE — Telephone Encounter (Unsigned)
 Copied from CRM [id number]. Topic: Appointments - Transfer of Care >> Oct 07, 2023  1:00 PM Carl Hernandez wrote: Pt is requesting to transfer FROM: Dr. Melvenia Pt is requesting to transfer TO: Carl Hernandez  It is the responsibility of the team the patient would like to transfer to [facility]) to reach out to the patient if for any reason this transfer is not acceptable.

## 2023-10-14 ENCOUNTER — Other Ambulatory Visit: Payer: Self-pay

## 2023-10-14 DIAGNOSIS — K219 Gastro-esophageal reflux disease without esophagitis: Secondary | ICD-10-CM

## 2023-10-14 MED ORDER — OMEPRAZOLE 20 MG PO CPDR: 20.0000 mg | DELAYED_RELEASE_CAPSULE | Freq: Every day | ORAL | 5 refills | Status: AC

## 2023-10-14 NOTE — Telephone Encounter (Unsigned)
 Copied from CRM [id number]. Topic: Clinical - Medication Refill >> Oct 14, 2023 11:55 AM Olam RAMAN wrote: Medication: omeprazole  (PRILOSEC) 20 MG capsule  Has the patient contacted their pharmacy? Yes (Agent: If no, request that the patient contact the pharmacy for the refill. If patient does not wish to contact the pharmacy document the reason why and proceed with request.) (Agent: If yes, when and what did the pharmacy advise?)  This is the patient's preferred pharmacy:  [facility] [phone] - [place], [place] - [address] AT [facility] [address] [place] [address] Phone: [phone] Fax: [phone]  [facility] [address], [place] - [address] [place] [address] [place] [address] [place] [address] Phone: [phone] Fax: [phone]  Is this the correct pharmacy for this prescription? Yes If no, delete pharmacy and type the correct one.   Has the prescription been filled recently? Yes  Is the patient out of the medication? Yes  Has the patient been seen for an appointment in the last year OR does the patient have an upcoming appointment? Yes  Can we respond through MyChart? No  Agent: Please be advised that Rx refills may take up to 3 business days. We ask that you follow-up with your pharmacy.

## 2023-12-08 ENCOUNTER — Ambulatory Visit: Payer: Self-pay

## 2023-12-08 VITALS — BP 128/87 | HR 61 | Ht 65.0 in | Wt 212.0 lb

## 2023-12-08 DIAGNOSIS — K219 Gastro-esophageal reflux disease without esophagitis: Secondary | ICD-10-CM | POA: Diagnosis not present

## 2023-12-08 DIAGNOSIS — Z0001 Encounter for general adult medical examination with abnormal findings: Secondary | ICD-10-CM | POA: Diagnosis not present

## 2023-12-08 DIAGNOSIS — E669 Obesity, unspecified: Secondary | ICD-10-CM

## 2023-12-08 NOTE — Progress Notes (Signed)
 Established Patient Office Visit  Subjective   Patient ID: Carl Hernandez, male    DOB: [birth year]  Age: 48 y.o. MRN: [id number]  Chief Complaint  Patient presents with   Annual Exam    HPI Discussed the use of AI scribe software for clinical note transcription with the patient, who gave verbal consent to proceed.  History of Present Illness    Carl Hernandez is a 48 year old male who presents for a follow-up visit and annual exam.  He denies any complaints or concerns at this time.      Patient Active Problem List   Diagnosis Date Noted   Encounter for well adult exam with abnormal findings 11/12/2022   Need for influenza vaccination 11/12/2022   Subacute maxillary sinusitis 11/04/2022   Need for Tdap vaccination 02/26/2022   Obesity (BMI 35.0-39.9 without comorbidity) 02/26/2022   Elevated LDL cholesterol level 11/26/2021   Screening for colon cancer 11/26/2021   Encounter for general adult medical examination with abnormal findings 11/06/2021   Left-sided chest wall pain 11/06/2021   Heart palpitations 11/06/2021   Malaise and fatigue 09/26/2021   Nasal congestion 09/26/2021   Gastroesophageal reflux disease without esophagitis 08/22/2021   Seasonal allergic rhinitis due to pollen 11/13/2020    ROS    Objective:     BP 128/87   Pulse 61   Ht 5' 5 (1.651 m)   Wt 212 lb 0.6 oz (96.2 kg)   SpO2 97%   BMI 35.29 kg/m  BP Readings from Last 3 Encounters:  12/08/23 128/87  01/09/23 118/82  11/12/22 116/80   Wt Readings from Last 3 Encounters:  12/08/23 212 lb 0.6 oz (96.2 kg)  01/09/23 214 lb (97.1 kg)  11/12/22 212 lb 12.8 oz (96.5 kg)      Physical Exam Vitals and nursing note reviewed.  Constitutional:      Appearance: Normal appearance.  HENT:     Head: Normocephalic.     Right Ear: Tympanic membrane, ear canal and external ear normal.     Left Ear: Tympanic membrane, ear canal and external ear normal.     Nose: Nose normal.     Mouth/Throat:      Mouth: Mucous membranes are moist.     Pharynx: Oropharynx is clear.  Eyes:     Extraocular Movements: Extraocular movements intact.     Pupils: Pupils are equal, round, and reactive to light.  Cardiovascular:     Rate and Rhythm: Normal rate and regular rhythm.  Pulmonary:     Effort: Pulmonary effort is normal.     Breath sounds: Normal breath sounds.  Abdominal:     General: Bowel sounds are normal.     Palpations: Abdomen is soft.  Musculoskeletal:        General: Normal range of motion.     Cervical back: Normal range of motion and neck supple.  Skin:    General: Skin is warm and dry.  Neurological:     Mental Status: He is alert and oriented to person, place, and time.  Psychiatric:        Mood and Affect: Mood normal.        Thought Content: Thought content normal.    No results found for any visits on 12/08/23.  Last CBC Lab Results  Component Value Date   WBC 9.0 11/12/2022   HGB 15.2 11/12/2022   HCT 45.7 11/12/2022   MCV 89 11/12/2022   MCH 29.6 11/12/2022   RDW 11.9 11/12/2022  PLT 327 11/12/2022   Last metabolic panel Lab Results  Component Value Date   GLUCOSE 79 11/12/2022   NA 139 11/12/2022   K 4.1 11/12/2022   CL 101 11/12/2022   CO2 24 11/12/2022   BUN 13 11/12/2022   CREATININE 1.05 11/12/2022   EGFR 88 11/12/2022   CALCIUM 9.4 11/12/2022   PROT 7.1 11/12/2022   ALBUMIN 4.3 11/12/2022   LABGLOB 2.8 11/12/2022   BILITOT 0.5 11/12/2022   ALKPHOS 91 11/12/2022   AST 23 11/12/2022   ALT 39 11/12/2022   Last lipids Lab Results  Component Value Date   CHOL 175 11/12/2022   HDL 35 (L) 11/12/2022   LDLCALC 97 11/12/2022   TRIG 250 (H) 11/12/2022   CHOLHDL 5.0 11/12/2022   Last hemoglobin A1c Lab Results  Component Value Date   HGBA1C 5.6 11/12/2022   Last thyroid functions Lab Results  Component Value Date   TSH 1.000 11/12/2022   FREET4 1.32 11/12/2022   Last vitamin D  Lab Results  Component Value Date   VD25OH 32.5  11/12/2022      The 10-year ASCVD risk score (Arnett DK, et al., 2019) is: 3.4%    Assessment & Plan:   Problem List Items Addressed This Visit       Digestive   Gastroesophageal reflux disease without esophagitis   GERD managed with omeprazole  and reduced caffeine. Symptoms include palpitations and lightheadedness, likely GERD-related. Previous cardiology workup normal.  - Continue omeprazole . - Limit caffeine intake.        Other   Obesity (BMI 35.0-39.9 without comorbidity)   BMI 35.4.  Lifestyle modifications aimed at weight loss were reviewed again today.  Repeat labs ordered.      Relevant Orders   CBC with Differential/Platelet   CMP14+EGFR   Lipid panel   Hemoglobin A1c   TSH + free T4   VITAMIN D  25 Hydroxy (Vit-D Deficiency, Fractures)   Encounter for well adult exam with abnormal findings - Primary   Routine visit with normal blood pressure. No medication refills needed. - Fasting labs reordered.       Relevant Orders   CBC with Differential/Platelet   CMP14+EGFR   Lipid panel   Hemoglobin A1c   TSH + free T4   VITAMIN D  25 Hydroxy (Vit-D Deficiency, Fractures)    Return in about 1 year (around 12/07/2024).    Leita Longs, FNP

## 2023-12-08 NOTE — Assessment & Plan Note (Signed)
 GERD managed with omeprazole  and reduced caffeine. Symptoms include palpitations and lightheadedness, likely GERD-related. Previous cardiology workup normal.  - Continue omeprazole . - Limit caffeine intake.

## 2023-12-08 NOTE — Assessment & Plan Note (Signed)
 BMI 35.4.  Lifestyle modifications aimed at weight loss were reviewed again today.  Repeat labs ordered.

## 2023-12-08 NOTE — Assessment & Plan Note (Signed)
 Routine visit with normal blood pressure. No medication refills needed. - Fasting labs reordered.

## 2023-12-09 LAB — CBC WITH DIFFERENTIAL/PLATELET
Basophils Absolute: 0 x10E3/uL (ref 0.0–0.2)
Basos: 1 %
EOS (ABSOLUTE): 0.1 x10E3/uL (ref 0.0–0.4)
Eos: 2 %
Hematocrit: 46.9 % (ref 37.5–51.0)
Hemoglobin: 15.7 g/dL (ref 13.0–17.7)
Immature Grans (Abs): 0 x10E3/uL (ref 0.0–0.1)
Immature Granulocytes: 0 %
Lymphocytes Absolute: 2.6 x10E3/uL (ref 0.7–3.1)
Lymphs: 37 %
MCH: 30.1 pg (ref 26.6–33.0)
MCHC: 33.5 g/dL (ref 31.5–35.7)
MCV: 90 fL (ref 79–97)
Monocytes Absolute: 0.4 x10E3/uL (ref 0.1–0.9)
Monocytes: 6 %
Neutrophils Absolute: 3.8 x10E3/uL (ref 1.4–7.0)
Neutrophils: 54 %
Platelets: 274 x10E3/uL (ref 150–450)
RBC: 5.21 x10E6/uL (ref 4.14–5.80)
RDW: 12.5 % (ref 11.6–15.4)
WBC: 7 x10E3/uL (ref 3.4–10.8)

## 2023-12-09 LAB — CMP14+EGFR
ALT: 34 IU/L (ref 0–44)
AST: 21 IU/L (ref 0–40)
Albumin: 4.1 g/dL (ref 4.1–5.1)
Alkaline Phosphatase: 82 IU/L (ref 47–123)
BUN/Creatinine Ratio: 11 (ref 9–20)
BUN: 11 mg/dL (ref 6–24)
Bilirubin Total: 0.5 mg/dL (ref 0.0–1.2)
CO2: 22 mmol/L (ref 20–29)
Calcium: 9.4 mg/dL (ref 8.7–10.2)
Chloride: 102 mmol/L (ref 96–106)
Creatinine, Ser: 0.99 mg/dL (ref 0.76–1.27)
Globulin, Total: 2.6 g/dL (ref 1.5–4.5)
Glucose: 85 mg/dL (ref 70–99)
Potassium: 4.8 mmol/L (ref 3.5–5.2)
Sodium: 138 mmol/L (ref 134–144)
Total Protein: 6.7 g/dL (ref 6.0–8.5)
eGFR: 94 mL/min/1.73 (ref >59)

## 2023-12-09 LAB — HEMOGLOBIN A1C
Est. average glucose Bld gHb Est-mCnc: 114 mg/dL
Hgb A1c MFr Bld: 5.6 % (ref 4.8–5.6)

## 2023-12-09 LAB — LIPID PANEL
Chol/HDL Ratio: 3.6 ratio (ref 0.0–5.0)
Cholesterol, Total: 168 mg/dL (ref 100–199)
HDL: 47 mg/dL (ref >39)
LDL Chol Calc (NIH): 105 mg/dL — ABNORMAL HIGH (ref 0–99)
Triglycerides: 85 mg/dL (ref 0–149)
VLDL Cholesterol Cal: 16 mg/dL (ref 5–40)

## 2023-12-09 LAB — TSH+FREE T4
Free T4: 1.15 ng/dL (ref 0.82–1.77)
TSH: 1.4 u[IU]/mL (ref 0.450–4.500)

## 2023-12-09 LAB — VITAMIN D 25 HYDROXY (VIT D DEFICIENCY, FRACTURES): Vit D, 25-Hydroxy: 28 ng/mL — ABNORMAL LOW (ref 30.0–100.0)
